# Patient Record
Sex: Male | Born: 1960 | Race: White | Hispanic: No | Marital: Married | Smoking: Never smoker
Health system: Southern US, Community
[De-identification: ages and names within clinical notes are randomized; demographics above are authoritative.]

## PROBLEM LIST (undated history)

## (undated) DIAGNOSIS — C801 Malignant (primary) neoplasm, unspecified: Secondary | ICD-10-CM

## (undated) DIAGNOSIS — R011 Cardiac murmur, unspecified: Secondary | ICD-10-CM

## (undated) DIAGNOSIS — J189 Pneumonia, unspecified organism: Secondary | ICD-10-CM

## (undated) DIAGNOSIS — R7303 Prediabetes: Secondary | ICD-10-CM

## (undated) DIAGNOSIS — I10 Essential (primary) hypertension: Secondary | ICD-10-CM

## (undated) HISTORY — PX: COLONOSCOPY: SHX174

## (undated) HISTORY — PX: APPENDECTOMY: SHX54

## (undated) HISTORY — PX: SKIN CANCER EXCISION: SHX779

## (undated) HISTORY — PX: MOHS SURGERY: SHX181

---

## 2004-08-26 ENCOUNTER — Ambulatory Visit: Payer: Self-pay

## 2005-03-17 HISTORY — PX: HERNIA REPAIR: SHX51

## 2005-08-11 ENCOUNTER — Ambulatory Visit: Payer: Self-pay | Admitting: General Surgery

## 2008-07-27 ENCOUNTER — Emergency Department: Payer: Self-pay | Admitting: Emergency Medicine

## 2008-10-12 ENCOUNTER — Ambulatory Visit: Payer: Self-pay | Admitting: Internal Medicine

## 2009-06-21 ENCOUNTER — Ambulatory Visit: Payer: Self-pay | Admitting: Sports Medicine

## 2009-06-21 DIAGNOSIS — M76829 Posterior tibial tendinitis, unspecified leg: Secondary | ICD-10-CM

## 2009-06-21 DIAGNOSIS — R269 Unspecified abnormalities of gait and mobility: Secondary | ICD-10-CM | POA: Insufficient documentation

## 2009-06-21 DIAGNOSIS — M216X9 Other acquired deformities of unspecified foot: Secondary | ICD-10-CM

## 2009-06-21 DIAGNOSIS — M25579 Pain in unspecified ankle and joints of unspecified foot: Secondary | ICD-10-CM

## 2009-07-19 ENCOUNTER — Ambulatory Visit: Payer: Self-pay | Admitting: Sports Medicine

## 2009-07-19 DIAGNOSIS — M2170 Unequal limb length (acquired), unspecified site: Secondary | ICD-10-CM | POA: Insufficient documentation

## 2010-02-18 ENCOUNTER — Ambulatory Visit: Payer: Self-pay | Admitting: Internal Medicine

## 2010-02-28 ENCOUNTER — Ambulatory Visit: Payer: Self-pay | Admitting: Sports Medicine

## 2010-02-28 DIAGNOSIS — M84369A Stress fracture, unspecified tibia and fibula, initial encounter for fracture: Secondary | ICD-10-CM | POA: Insufficient documentation

## 2010-03-04 ENCOUNTER — Ambulatory Visit: Payer: Self-pay | Admitting: Internal Medicine

## 2010-04-11 ENCOUNTER — Ambulatory Visit
Admission: RE | Admit: 2010-04-11 | Discharge: 2010-04-11 | Payer: Self-pay | Source: Home / Self Care | Attending: Sports Medicine | Admitting: Sports Medicine

## 2010-04-11 DIAGNOSIS — M25559 Pain in unspecified hip: Secondary | ICD-10-CM | POA: Insufficient documentation

## 2010-04-16 NOTE — Assessment & Plan Note (Signed)
Summary: F/U,MC   Vital Signs:  Patient profile:   50 year old male BP sitting:   155 / 89  Vitals Entered By: Lillia Pauls CMA (Jul 19, 2009 10:10 AM)  History of Present Illness: F/U post tib and flex dig tendinosis 40% better half ironman in 2 days Was able to run 10 miles in his OTC insoles with some additional arch support added by [place] sports insoles not as comfortable but also in difft shoe  doing the exercises uses voltaren gel  will be able to rest p this race  Allergies: No Known Drug Allergies  Physical Exam  General:  Well-developed,well-nourished,in no acute distress; alert,appropriate and cooperative throughout examination Msk:  less swelling at post area behind med malleolus not TTP good strength good motion  leg length shows that RT is 1.5 cms shorter excellent alignment  noter running gait shows slight shift to RT even w current heel lift he has placed a layer of foam padding to RT insole gait was improved w this   Impression & Recommendations:  Problem # 1:  ANKLE PAIN, RIGHT (ICD-719.47) improved keep up ice, exercises  Problem # 2:  TENDINITIS TIBIALIS (ICD-726.72) volt gel - cont  as long as improving will not need NTG  rescan later if sxs persiste  Problem # 3:  ABNORMALITY OF GAIT (ICD-781.2) pronation was well controlled with inserts and scaphoid pads  see below  Problem # 4:  UNEQUAL LEG LENGTH (ICD-736.81) This did seem to be affecting gait and thus with somne padding we saw improvement  if he decides to run longer distances - half or marathons will prob need custom orthotics to correct this  Complete Medication List: 1)  Voltaren 1 % Gel (Diclofenac sodium) .... Apply to affected area qid

## 2010-04-16 NOTE — Assessment & Plan Note (Signed)
Summary: NP,R MEDIAL ANKLE PAIN X MARCH 12   Vital Signs:  Patient profile:   50 year old male Height:      72 inches Weight:      190 pounds BMI:     25.86 BP sitting:   150 / 94  Vitals Entered By: Lillia Pauls CMA (June 21, 2009 10:43 AM)  History of Present Illness: 50 yo M presents with medial right ankle pain  Patient denies previous issues with right ankle/foot On March 12th went on a long run with wife and some friends Slowed down with another runner several miles into long run (about 10 min/mile pace - usually runs 8:30 pace) States while doing this on hilly course developed slowly worsening pain medial ankle and lower leg behind tibia/med malleolus. Was able to run through pain Took 5 days off and ran 4 mile run - sore first mile but picked up pace and resolved. On 3/20 had pain in first 2 miles of a half marathon when running a slow pace (large crowd) - picked up pace for remainder and pain resolved. Pain seems worse when running downhill No obvious swelling or bruising. Has not tried anything for pain.  Allergies (verified): No Known Drug Allergies  Physical Exam  General:  Well-developed,well-nourished,in no acute distress; alert,appropriate and cooperative throughout examination Msk:  R foot/ankle: No gross deformity, swelling, bruising Cavus feet Normal post tib firing when on tiptoes No focal TTP but course of post tib and some deep retrocalcaneal area is where he points as pain is here during runs. FROM toes and ankle. Neg ant drawer and talar tilt. Strength 5/5 all motions without reproduction of pain NV intact distally Leg lengths equal SI joints tight bilaterally with fabers. Gait normal with walking and jogging. Additional Exam:  MSK u/s R ankle: post tib tendon is intact.  Achilles intact without thickening or evidence of retrocalcaneal bursitis.  Target sign distally post tib tendon and some fluid on longitudinal view surrounding tendon.  No  increased neovascularity.  Target sign of flexor digitorum as well.     Impression & Recommendations:  Problem # 1:  ANKLE PAIN, RIGHT (ICD-719.47) Assessment New  Post tib and flexor digitorum tendinitis without evidence of tearing on ultrasound.  Cushion with scaphoid pads and comforthotics to neutralize gait and foot strike, take pressure off these tendons.  Voltaren gel, exercises demonstrated.  F/u in 4 weeks for recheck.  Activities as tolerated.  Orders: [place] LIMITED ([id number])  Problem # 2:  TENDINITIS TIBIALIS (ICD-726.72) Assessment: New  See #1 above.  Orders: [place] LIMITED ([id number])  Problem # 3:  ABNORMALITY OF GAIT (ICD-781.2) Assessment: New  neutralized with scaphoid pads in insoles  Orders: [place] LIMITED ([id number])  Problem # 4:  CAVUS DEFORMITY OF FOOT, ACQUIRED (ICD-736.73) Assessment: New  comforthotics with scaphoid pads to cushion and support.  Orders: [place] LIMITED ([id number])  Complete Medication List: 1)  Voltaren 1 % Gel (Diclofenac sodium) .... Apply to affected area qid  Patient Instructions: 1)  Use the insoles with the scaphoid pads as often as possible especially when running. 2)  Ice for 15 minutes at a time 3-4 times a day - put in an ice bucket at the end of the day for 10 minutes. 3)  Pigeon toed walking, theraband exercises with plantarflexion and internal rotation 3 x 10 once a day. 4)  Raise and lower on a step 3 x 15 once a day. 5)  Follow up with [place] in 4 weeks  for reevaluation. Prescriptions: VOLTAREN 1 % GEL (DICLOFENAC SODIUM) Apply to affected area qid  #3x100g x 1   Entered and Authorized by:   Norton Blizzard MD   Signed by:   Norton Blizzard MD on 06/21/2009   Method used:   Print then Give to Patient   RxID:   [id number]

## 2010-04-18 NOTE — Assessment & Plan Note (Signed)
Summary: RT ANKLE,LOWER LEFT LEG PAIN,MC   Vital Signs:  Patient profile:   50 year old male BP sitting:   146 / 96  Vitals Entered By: Lillia Pauls CMA (February 28, 2010 11:10 AM)  CC:  Left lower leg pain and f/u R ankle.  History of Present Illness: 50yo male to office for f/u of R medial ankle pain, but with new c/o of L lower leg pain. Continues to have intermittant dull pain along medial ankle, although greatly improved.  Denies swelling or instability. Continues to run - approx 18-20 miles/wk.  Has completed 1/2 Ironman in the past, plans to do another in May 2012. During a run 12/16/09 started to experience pain along lower portion of medial shin.  Denies injury or trauma.  Has noted some swelling & small nodule in this area.  Stopped running & training for about 2-weeks, but still had pain.  Pain prominent when starting to run or walk.  Normally able to run without much pain.  Pain most noticeable about 20-mins after running. Using ibuprofen without any help. No hx of stress fractures. Continues to wear insoles with scaphoid pads & small lift on right.  No custom orthotics.  Allergies: No Known Drug Allergies PMH-FH-SH reviewed for relevance  Review of Systems      See HPI  Physical Exam  General:  Well-developed,well-nourished,in no acute distress; alert,appropriate and cooperative throughout examination Msk:  LOWER LEG: - Lt lower leg with visible nodule over medial aspect of distal tiba.  Area very TTP.  No tenderness over proximal tibia.  No erythema or bruising.  Neg Tap test.  Able to perform hop test with minimal discomfort.   - Rt lower leg without visible deformity.  No tenderness to palpation.  ANKLES: FROM without pain.  No laxity.  No weakness.  FEET:  moderate arch, no transverse arch collapse.  No significant callous formation.  GAIT: Rt leg  ~1cm shorter than left.  Mild intoeing & pronation of right foot with running.  Forefoot striker with overall good  form. Pulses:  +2/4 DP & PT b/l Neurologic:  sensation intact to light touch.   Additional Exam:  MSK U/S: L tibia- distal medial tibia with periosteal thickening and small amount of periosteal fluid seen on long view.  Transverse view shows cortical irregularity with cap-sign.  No increased doppler flow.  Findings consistent with healing tibial stress fx.  R tibia with no periosteal thickening, no periosteal fluid, & no signs of cortical disruption.  Images saved.   Impression & Recommendations:  Problem # 1:  STRESS FRACTURE, TIBIA (ICD-733.93) Assessment New  - Healing stress fx of Lt distal medial tibia as seen on MSK u/s - Fitted with ankle Aircast in office today.  Should wear this with all activity. - Ok to continuing running, encouraged to run on soft surfaces.  Should decrease running if having pain. - May want to increase cross-training with more biking/swimming - Start taking Ca 2000mg  + Vit D 800 International Units daily - Ice as needed after activity - Tylenol/ibuprofen as needed. - Cont. to wear insoles with scaphoid pads, Blue cushion added to bottom of left insole & additional layer of blue foam added to Rt insole.  May be candidate for custom orthotics in the future. - f/u 6 weeks for re-evaluation & f/u MSK U/S, encouraged to call with questions/concerns.  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 Apply to affected area qid  Other Orders: Aircast Ankle Brace ([id number])  Patient Instructions: 1)  You have a healing stress fracture of your left tibia. 2)  Ok to continue running, should cut back if having pain. 3)  Wear the aircast with any running/activity. 4)  Start taking  Calcium 2000mg  + Vit D 800 International Units daily. 5)  May ice as needed for discomfort after workouts. 6)  Follow-up 6-weeks, call with any questions or concerns.   Orders Added: 1)  Aircast Ankle Brace [[id number]] 2)  Est. Patient Level IV [82956] 3)  [place] LIMITED [[id number]]

## 2010-04-18 NOTE — Assessment & Plan Note (Signed)
Summary: F/U,MC   Vital Signs:  Patient profile:   50 year old male BP sitting:   133 / 90  Vitals Entered By: Lillia Pauls CMA (April 11, 2010 9:18 AM)  CC:  f/u stress fx.  History of Present Illness: 50yo male to office for f/u of healing L tibial stress fx.  Not having any pain at this time.  Wearing compression sleeves and using aircast with activity.  Overall left leg is feeling great. Able to run 4-5 times since last visit, but unable to run for last 2-weeks due to increasing R hip/leg pain.  Able to do stationary bike & bike trainer without pain.  Pain starts in buttock, but radiating into posterior aspect of thigh.  Thinks may have started with mild hamstring pull from doing leg press before Thanksgiving.  Since that time having increased tightness in his hamstring & he thinks this has affected his running stride.  Has hx of sciatica, but this feels different.  Went to chiropractor, but no improvement with several adjustments.  Chiropractor thought may be piriformis syndrome, went to massage therapist early this week  Buttock pain improved, but still having pain in hamstring.  Having some night-time pain.  Occasional numbness/tingling stopping at the knee.  Denies change in bowel or bladder.  Denies any back pain.  Allergies: No Known Drug Allergies  Review of Systems       per HPI, otherwise 10-pt ROS negative  Physical Exam  General:  Well-developed,well-nourished,in no acute distress; alert,appropriate and cooperative throughout examination Msk:  BACK: FROM without pain.  No scoliosis.  Minimal TTP over R piriformis & sciatic notch, no significant spasm in this area.  (+)FABER on R, mild tightness on left.  Good SI joint mobility.  neg SLR b/l.  Able to toe walk & heel walk.    HIPS:  FROM without pain, neg log roll.  Mild TTP in hamstring belly near ischial tuberosity, no palpable nodules or defects.  Good hamstring strength b/l.  Normal quad strength.  Normal abduction,  adduction, flexion, extension strength at hip b/l.  Tight piriformis on right.  LOWER LEG: - Lt lower leg: no deformity, bruising, or erythema.  Mild TTP over medial aspect of distal tibia - less tender than previous evaluation.  No tenderness over proximal tibia.  neg tap test.  Able to perform hop test. - Rt lower leg without visible deformity.  No tenderness to palpation.  Able to do hop test  ANKLES: FROM without pain.  No laxity.  No weakness.  FEET:  moderate arch, no transverse arch collapse.  No significant callous formation.  GAIT: Rt leg  ~1cm shorter than left.  Forefoot striker, mild intoeing & pronation of R foot.  Significant pain in R hip/hamstring with running.  No limp with walking. Pulses:  +2/4 DP & PT Neurologic:  sensation intact to light touch.   DTR +2/4 achilles & patella b/l   Impression & Recommendations:  Problem # 1:  STRESS FRACTURE, TIBIA (ICD-733.93) Assessment Improved - Healing L tibial stress fx - no pain with daily activities, minimal pain on exam today - Cont to wear compression sleeves & aircast with activity - Increase activity as able, decrease activity if having pain - f/u for custom orthotics in 2-3 weeks - plan for repeat msk u/s on f/u visit   Problem # 2:  HIP PAIN, RIGHT (ICD-719.45) Assessment: New - R hip pain - suspect related to right piriformis and altered gait mechanics from R short  leg - Demonstrated several lower extremity & piriformis stretches, handout given - should do these stretches at least twice daily - Also demonstrated & given handout for hip exercises including hip rotations, lateral step-ups & cross-over step-ups - Cont to cross train with biking & swimming.  Ok to start running if not having pain.  Should wear compression shorts with running/activity to keep R hip warm - f/u in 2-3 weeks for custom orthotics  Problem # 3:  UNEQUAL LEG LENGTH (ICD-736.81) -  Rt leg  ~1cm shorter than left - likely contributing to R hip  pain - cont to use temporary orthotics with lift in R shoe - f/u 2-3 weeks for custom orthotics  Complete Medication List: 1)  Voltaren 1 % Gel (Diclofenac sodium) .... Apply to affected area qid  Patient Instructions: 1)  Ok to ease back into training if not having pain.  Ok to continue cross training with biking & swimming. 2)  Run with compression shorts to help keep area warmer. 3)  do stretches for piriformis at least twice daily. 4)  Do hip exercises 1-2 times daily. 5)  Follow-up for custom orthotics on a Tues afternoon with Dr. Christella Hartigan.  will plan on ultrasound of your left tibia at that time. 6)  Call with any questions or concerns. Prescriptions: VOLTAREN 1 % GEL (DICLOFENAC SODIUM) Apply to affected area qid  #3x100g x 1   Entered by:   Rochele Pages RN   Authorized by:   Enid Baas MD   Signed by:   Rochele Pages RN on 04/11/2010   Method used:   Electronically to        [facility] [phone]* (retail)       [address]       [place], [place]  [address]       Ph: [phone]       Fax:    RxID:   [id number]    Orders Added: 1)  Est. Patient Level III [62952]

## 2010-04-23 ENCOUNTER — Encounter: Payer: Self-pay | Admitting: Family Medicine

## 2010-04-23 ENCOUNTER — Encounter (INDEPENDENT_AMBULATORY_CARE_PROVIDER_SITE_OTHER): Payer: BC Managed Care – PPO | Admitting: Family Medicine

## 2010-04-23 DIAGNOSIS — M2170 Unequal limb length (acquired), unspecified site: Secondary | ICD-10-CM

## 2010-04-23 DIAGNOSIS — M84369A Stress fracture, unspecified tibia and fibula, initial encounter for fracture: Secondary | ICD-10-CM

## 2010-04-23 DIAGNOSIS — M25559 Pain in unspecified hip: Secondary | ICD-10-CM

## 2010-05-02 NOTE — Assessment & Plan Note (Signed)
Summary: orthotics per fields,mc   Vital Signs:  Patient profile:   50 year old male Height:      72 inches Weight:      195 pounds BMI:     26.54 BP sitting:   154 / 90  Vitals Entered By: Lillia Pauls CMA (April 23, 2010 2:14 PM) CC: custom orthotics   CC:  custom orthotics.  History of Present Illness: 50yo male with hx of healing L tibial stress fx to office for custom orthotics.  No longer having any tibia pain.  Has been wearing OTC orthotics with our adjustments including b/l scaphoid pads & heel lift on right to account for leg length difference.  These have been comfortable to him.  Has not started back to running due to persistant R hip/buttock pain.  Using stationary bike.  Has been doing stretches multiple times during the day & has been going to massage therapist.  Feels slightly improved, but still present.  Denies any numbness/tingling, denies lower extremity weakness.  Denies changes in bowel/bladder.  Preventive Screening-Counseling & Management  Alcohol-Tobacco     Smoking Status: never  Allergies (verified): No Known Drug Allergies  Social History: Smoking Status:  never  Review of Systems       per HPI  Physical Exam  General:  Well-developed,well-nourished,in no acute distress; alert,appropriate and cooperative throughout examination Msk:  BACK: FROM without pain.  No scoliosis.  Minimal TTP over R piriformis, no spasm in this area.  (+)FABER on R, mild tightness on left.  Good SI joint mobility.  neg SLR b/l.      HIPS:  FROM without pain, neg log roll.  Mild TTP in hamstring belly near ischial tuberosity, no palpable nodules or defects.  Good hamstring strength b/l.  Normal quad strength.  Normal hip strength.  Tight piriformis on right.  LOWER LEG: - Lt lower leg: no deformity, bruising, or erythema. No tenderness over proximal or distal tibia.   neg tap test. - Rt lower leg without visible deformity.  No tenderness to palpation.  ANKLES: FROM  without pain.  No laxity.  No weakness.  FEET:  moderate arch, no transverse arch collapse.  No significant callous formation.  GAIT: Rt leg  ~1cm shorter than left.  Forefoot striker, mild intoeing & pronation of R foot.  No limp.  Mild pain in R hip/hamstring with running. Pulses:  +2/4 DP & PT Neurologic:  sensation intact to light touch.     Impression & Recommendations:  Problem # 1:  STRESS FRACTURE, TIBIA (ICD-733.93) Assessment Improved  - No further symptoms at this time. - Fitted with custom orthotic in office today: Patient was fitted for a : standard, cushioned, semi-rigid orthotic. The orthotic was heated and afterward the patient stood on the orthotic blank positioned on the orthotic stand. The patient was positioned in subtalar neutral position and 10 degrees of ankle dorsiflexion in a weight bearing stance. After completion of molding, a stable base was applied to the orthotic blank. The blank was ground to a stable position for weight bearing. Size: 12, blue swirl Base: blue EVA Posting: none Additional orthotic padding:  Additional layer of stick-on blue EVA applied to R heel for leg length difference Orthotics comfortable in office today & gait neutral 45-mins spent with patient for orthotic prep - May advance activity -  cont to wear compression sleeves & aircast with activity.  Reviewed Run/Walk program, cont. cross-training on days when not running. - f/u 3-4 weeks for  re-evaluation  Orders: [employer], each unit [phone])  Problem # 2:  HIP PAIN, RIGHT (ICD-719.45) Assessment: Improved  - Still with tightness in his R piriformis, suspect this may be related to leg length difference & altered gait mechanics.   - Fitted with custom orthotic as stated above - Cont. lower extremity stretches at least twice daily.  Reviewed stretches today - Cont. hip exercises & step-up exercises - Heating pad as needed - f/u 3-4 weeks for  re-evaluation  Orders: [employer], each unit (L3002)  Problem # 3:  UNEQUAL LEG LENGTH (ICD-736.81)  - R leg 1cm shorter than left - Fitted with custom orthotic in office today with additional lift on R orthotic.  comfortable in office today & leg length difference well corrected.  Orders: [employer], each unit [phone])  Complete Medication List: 1)  Voltaren 1 % Gel (Diclofenac sodium) .... Apply to affected area qid   Orders Added: 1)  Est. Patient Level IV [09811] 2)  [employer], each unit [L3002]

## 2010-05-14 ENCOUNTER — Ambulatory Visit (INDEPENDENT_AMBULATORY_CARE_PROVIDER_SITE_OTHER): Payer: BC Managed Care – PPO | Admitting: Family Medicine

## 2010-05-14 ENCOUNTER — Encounter: Payer: Self-pay | Admitting: Family Medicine

## 2010-05-14 DIAGNOSIS — M84369A Stress fracture, unspecified tibia and fibula, initial encounter for fracture: Secondary | ICD-10-CM

## 2010-05-14 DIAGNOSIS — M25559 Pain in unspecified hip: Secondary | ICD-10-CM

## 2010-05-23 NOTE — Assessment & Plan Note (Signed)
Summary: F/U L tibia & R hip   Vital Signs:  Patient profile:   50 year old male Pulse rate:   89 / minute BP sitting:   167 / 100  (right arm)  Vitals Entered By: Rochele Pages RN (May 14, 2010 2:02 PM) CC: f/u rt hip and lt tibia stress fx   CC:  f/u rt hip and lt tibia stress fx.  History of Present Illness: 50yo male for f/u L tibial stress fx & Rt hip pain.  He is approx 8-9 wks s/p L tibial stress fx.  Feels 90% improved overall, occasional pain, denies any swelling.  Started walk-run program after getting custom orthotics & running 3-4 days/wk about 4-6 miles each run and doing well.  Currently running on cushioned track at [facility].  Wearing calf sleeve, but no longer using aircast.  continues to x-train with biking & swimming.  Wearing custom orthotics & they are comfortable.  Rt hip/hamstring pain is approx 70-75% improved.  Still some occasional tightness at bottom of buttock & superior aspect of hamstring.  Continues lower extremity stretches & exercises which are helpful.  Has tried using neoprene thigh sleeve, but sometimes causes more irritation.    Preventive Screening-Counseling & Management  Alcohol-Tobacco     Smoking Status: never  Allergies (verified): No Known Drug Allergies  Review of Systems       per HPI  Physical Exam  General:  Well-developed,well-nourished,in no acute distress; alert,appropriate and cooperative throughout examination Msk:  BACK: FROM without pain.  No scoliosis.  No tenderness over piriformis today, no spasm.  Mildly (+)FABER on R, but improved compared to previous evaluation.  Good SI joint mobility.  neg SLR b/l.      HIPS:  FROM without pain, neg log roll.  Minimal TTP at ischial tuberosity & over proximal hamstrings, no palpable nodules or defects.  Good hamstring strength b/l.  Normal quad strength.  Normal hip strength.  Minimal piriformis tightness today.    LOWER LEG: - Lt lower leg: no deformity, bruising, or erythema. No  tenderness over proximal or distal tibia.   neg tap test.  Able to perform hop test. - Rt lower leg without visible deformity.  No tenderness to palpation.  Neurovascularly intact distally   Impression & Recommendations:  Problem # 1:  STRESS FRACTURE, TIBIA (ICD-733.93) Assessment Improved - >8-weeks out from diagnosis, progressing well - Cont. walk-run program every other day with cross training on off days - May start trying to run continuously up to 20-mins next week - should try to stay on soft/level surfaces; may start to increase duration & frequency of runs if tolerating well.  Reviewed Tibial Stress Fx Tx Protocol - Cont. running with calf sleeve, do not need brace at this time - Cont. to wear custom orthotics - f/u 4 weeks  Problem # 2:  HIP PAIN, RIGHT (ICD-719.45) Assessment: Improved  - Continuing to improve, noted to have improved piriformis flexibility.  Mild amount of pain near origin of hamstrings. - fitted with body helix thigh sleeve today which was comfortable - wear this with activity - cont. lower extremity stretching exercises - Reviewed hamstring exercises - cont. cross training - f/u 4 weeks  Orders: Garment,belt,sleeve or other covering ,elastic or similar stretch (B1478)  Complete Medication List: 1)  Voltaren 1 % Gel (Diclofenac sodium) .... Apply to affected area qid   Orders Added: 1)  Est. Patient Level III [29562] 2)  Garment,belt,sleeve or other covering ,elastic or similar stretch [  A4466]

## 2010-07-02 ENCOUNTER — Ambulatory Visit: Payer: BC Managed Care – PPO | Admitting: Family Medicine

## 2010-07-18 ENCOUNTER — Encounter: Payer: Self-pay | Admitting: Family Medicine

## 2010-07-18 ENCOUNTER — Ambulatory Visit (INDEPENDENT_AMBULATORY_CARE_PROVIDER_SITE_OTHER): Payer: BC Managed Care – PPO | Admitting: Family Medicine

## 2010-07-18 DIAGNOSIS — M84369A Stress fracture, unspecified tibia and fibula, initial encounter for fracture: Secondary | ICD-10-CM

## 2010-07-18 DIAGNOSIS — M2170 Unequal limb length (acquired), unspecified site: Secondary | ICD-10-CM

## 2010-07-18 DIAGNOSIS — M25559 Pain in unspecified hip: Secondary | ICD-10-CM

## 2010-07-18 MED ORDER — KETOPROFEN POWD: Status: DC

## 2010-07-18 NOTE — Assessment & Plan Note (Signed)
-   No pain at this time, completely resolved.

## 2010-07-18 NOTE — Progress Notes (Signed)
  Subjective:    Patient ID: Bobby Pace, male    DOB: 1960-11-08, 50 y.o.   MRN: [id number]  HPI 50yo male for f/u L tibial stress fx & Rt hip pain.   Patient is approximately 5 months status post left tibial stress fracture. He has been back to running approximately 20-25 miles a week without any pain. He has had no symptoms for quite some time.  Patient continues to have some persistent right hip/hamstring pain, but this is also greatly improved. Pain currently in the high hamstring and gluteal area it is intermittent in nature. Will occasionally feel tight prior to activity, but will loosen up about a half mile into runs. Has occasional fullness in his right gluteal area as if he is sitting on a deck of cards. He continues to do his lower chin he stretches and notes greatly improved flexibility. Has been using voltaren gel on this area intermittently which has been helpful, but is now out.  He took a felt heel lift out of his shoes recently which were placed to help with conditioning following a stress fracture and he felt significant improvement in his right hip pain. Has not been wearing his custom orthotics much because she feels lift in the right shoe has been too much. He continues to run, bike and swim.  Has been seeing a chiropractor intermittently since mid February and this has been helpful.  Review of Systems Per HPI, otherwise negative    Objective:   Physical Exam General: Well-developed,well-nourished,in no acute distress; alert,appropriate and cooperative throughout examination  Msk: BACK: FROM without pain. No scoliosis.  Mildly tender to palpation over the right piriformis and right ischial tuberosity, no palpable spasm. Negative FABER. Good SI joint mobility. neg SLR b/l.  HIPS: FROM without pain, neg log roll. Mild TTP at ischial tuberosity & over proximal hamstrings, no palpable nodules or defects. Good hamstring strength b/l. Normal quad strength. Normal hip strength. No  piriformis tightness today. LOWER LEG:  - Lt lower leg: no deformity, bruising, or erythema. No tenderness over proximal or distal tibia. neg tap test.  - Rt lower leg without visible deformity. No tenderness to palpation.  GAIT: Right leg approximately 5 mm shorter than the left. Good running form noted today, forefoot strikeer, pelvis level and no limping. Neurovascularly intact distally  MSK ultrasound:  Ultrasound of the right posterior hip at the ischial tuberosity revealed hypoechoic area near insertion of hamstring musculature consistent with ischial bursa. Normal-appearing hamstring tendons and hamstring muscles. No increased Doppler flow or neo-vessels appreciated. Images saved.        Assessment & Plan:

## 2010-07-18 NOTE — Progress Notes (Signed)
  Subjective:    Patient ID: Bobby Pace, male    DOB: [birth year], 50 y.o.   MRN: [id number]  HPI Patient is here today to followup on his left tibial stress fracture and his right heel/hamstring pain. Patient states that his tibial stress fracture on the left is completely healed and has no pain at all associated with the original injury. As far as his right hip/hamstring pain he still has some high hamstring/low gluteal pain from time to time as he runs. Typically a half a mile into a long run he loosens up and doesn't feel as bad during runs and after runs.    Review of Systems     Objective:   Physical Exam        Assessment & Plan:

## 2010-07-18 NOTE — Assessment & Plan Note (Addendum)
-   Right hip pain continues to improve. MSK ultrasound showed small fluid collection at the ischial tuberosity consistent with ischial bursitis.  Normal appearance of tendons at insertion on ischial tuberosity and normal appearance of muscle fibers. - Patient noted improvement in hip pain after removing heel lifts from his shoes that were placed to allow more cushion after his tibial stress fracture. Okay for him to continue running without these in his shoes. Custom orthotics have also been uncomfortable due to lift added to the right orthotic for leg length difference. This was ground down in the office today to make more comfortable. - Cont lower extremity stretching program & hip exercises - Encourage use of tennis ball under the area as a massage. - Given Rx for ketoprofen gel as he did have some improvement with voltaren gel which is currently discontinued - Should symptoms persist could consider merits of ultrasound guided corticosteroid injection - May continue to advance activity as tolerated - Followup as needed

## 2010-07-18 NOTE — Assessment & Plan Note (Signed)
-   Rt leg approx 5mm shorter than the right today.  Additional heel lift on right custom orthotic was ground down, but still leaving some additional lift.  Hope this will be more comfortable while he is running.

## 2011-01-22 ENCOUNTER — Ambulatory Visit (INDEPENDENT_AMBULATORY_CARE_PROVIDER_SITE_OTHER): Payer: BC Managed Care – PPO | Admitting: Sports Medicine

## 2011-01-22 VITALS — BP 138/80

## 2011-01-22 DIAGNOSIS — M25559 Pain in unspecified hip: Secondary | ICD-10-CM

## 2011-01-22 DIAGNOSIS — R269 Unspecified abnormalities of gait and mobility: Secondary | ICD-10-CM

## 2011-01-22 DIAGNOSIS — M2170 Unequal limb length (acquired), unspecified site: Secondary | ICD-10-CM

## 2011-01-22 MED ORDER — GABAPENTIN 300 MG PO CAPS
300.0000 mg | ORAL_CAPSULE | Freq: Every day | ORAL | Status: DC
Start: 2011-01-22 — End: 2011-05-05

## 2011-01-23 NOTE — Progress Notes (Signed)
  Subjective:    Patient ID: Bobby Pace, male    DOB: [birth year], 50 y.o.   MRN: [id number]  HPI Patient here for f/u of R gluteal pain and low back pain. 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 He denies pain with running. He  reports pain immediately following open swims. He describes severe sharp pain with low back flexion. The pain is usually isolated to the  right gluteal area, but  is occasionally right lumbar that radiates towards the right gluteal. He is not taking medication for the pain. He discontinue ketoprofen gel because he did not feel an improvement. He continues to massage with the tennis ball.   He currently participates in active release therapy for R hamstring pain.   He also has known leg length discrepancy. He is not wearing his orthotic due to discomfort.   Review of Systems Negative for isolated injury. Negative for fever, chills, weight loss, fatigue.     Objective:   Physical Exam GEN: well built/muscular male. NAD.  Back Exam:  Inspection:muscular, no scoliosis  Motion: Full ROM with no pain on flexion or extension  SLR seated:  Negative                     SLR lying: Negative  XSLR seated: Negative                    XSLR lying: Negative  Seated HS Flexibility: to mid shin.  Palpable tenderness: pain at R piriformis illicited with deep palpation and hip extension.  FABER: Positive for pain with R hip extension.  Sensory change: none  Reflex change: none   Strength at foot Plantar-flexion: 5 / 5    Dorsi-flexion: 5 / 5    Eversion: 5 / 5   Inversion:  5/ 5 Leg strength Quad:  5/ 5   Hamstring: 5 / 5   Hip flexor:  5/ 5   Hip abductors: 5 / 5 Gait Jogging: R leg approximately 1.5cm shorter than left,  in swinging gait to compensate for leg length inequality.       Assessment & Plan:

## 2011-01-23 NOTE — Patient Instructions (Signed)
Bobby Pace thank you for coming in today.  Your  continued right  Gluteal pain is  Cause by a from piriformis irritation with no with an element of sciatic nerve irritation and. Please start gabapentin nightly. If you work on your flexibility,  and   should be able to begin training for your triathlon in June.   You may continue to do the monthly active release therapy  for tight hamstrings.

## 2011-01-23 NOTE — Assessment & Plan Note (Addendum)
A: Patient now has pain following swimming. He has symptoms of sciatic nerve irritation.  P:  -Start Gabapentin q HS.  - Continue to advance activity as tolerated.  - Gave patient exercises to work on flexibility.

## 2011-01-23 NOTE — Assessment & Plan Note (Addendum)
A: untreated. Previous orthotic was uncomfortable, so he is not using it.  P: Soft heel orthotic to compensate for discrepancy.

## 2011-01-24 NOTE — Assessment & Plan Note (Signed)
Cont correction w orthotics Add heel lift  To regular shoes

## 2011-03-05 ENCOUNTER — Ambulatory Visit (INDEPENDENT_AMBULATORY_CARE_PROVIDER_SITE_OTHER): Payer: BC Managed Care – PPO | Admitting: Sports Medicine

## 2011-03-05 VITALS — BP 138/78

## 2011-03-05 DIAGNOSIS — M25559 Pain in unspecified hip: Secondary | ICD-10-CM

## 2011-03-05 MED ORDER — MELOXICAM 15 MG PO TABS: ORAL_TABLET | ORAL | Status: DC

## 2011-03-05 NOTE — Progress Notes (Signed)
  Subjective:    Patient ID: Bobby Pace, male    DOB: 12/06/1960, 50 y.o.   MRN: [id number]  HPI Brentley comes in for followup of his right buttock pain. He has a diagnosis of performed syndrome. He was seen prior, and started on gabapentin as well as some stretching. He notes that he is significantly better, however he still has some high hamstring pain. He notes this is worse after standing for long periods of time. Otherwise his range of motion is better, he doesn't really have any pain, strength is good. He is now able to run and bike with minimal sxs and actually standing or sitting too long are worse  Review of Systems    no fevers, chills, night sweats, weight loss. Objective:   Physical Exam General:  Well developed, well nourished, and in no acute distress. Neuro:  Alert and oriented x3, extra-ocular muscles intact. Skin: Warm and dry. Respiratory:  Not using accessory muscles, speaking in full sentences. MSK: Tender to palpation at left ischial tuberosity.  Hamstring strength is good.  Hip internal and external rotation is 60 bilaterally. Popliteal angle is 70 bilaterally indicating tight hamstrings. Negative straight leg raise. Strength, sensation, and deep tendon reflexes are symmetric throughout.      Assessment & Plan:

## 2011-03-05 NOTE — Assessment & Plan Note (Signed)
I think he is doing well with the above treatment. I would like him to work more on his excessively tight hamstrings. I would also like him to go ahead and start an anti-inflammatory on a daily basis for 2 weeks for his ischial bursitis. We can see him back in 4-6 weeks, if no better we can consider an injection into his ischial bursa.

## 2011-03-05 NOTE — Patient Instructions (Signed)
Pelvic tilts. Back bridges. WORK ON THE HAMSTRING TIGHTNESS! Meloxicam daily for 2 weeks. Come back to see [place] in 4-6 weeks.

## 2011-04-03 ENCOUNTER — Ambulatory Visit (INDEPENDENT_AMBULATORY_CARE_PROVIDER_SITE_OTHER): Payer: BC Managed Care – PPO | Admitting: Sports Medicine

## 2011-04-03 VITALS — BP 128/72

## 2011-04-03 DIAGNOSIS — M25559 Pain in unspecified hip: Secondary | ICD-10-CM

## 2011-04-03 NOTE — Patient Instructions (Addendum)
Continue gabapentin 300 mg daily  Continue meloxicam as needed  Please follow up in 4-6 weeks   Thank you for seeing [place] today!

## 2011-04-03 NOTE — Assessment & Plan Note (Signed)
His piriformis syndrome seems to be improving  We will keep up the exercises and stretches  Use Mobic when necessary  Keep up the gabapentin 300 at night  Recheck 6 weeks and at that time we might consider weaning the gabapentin if he continues to do well

## 2011-04-03 NOTE — Progress Notes (Signed)
  Subjective:    Patient ID: Fares Ramthun, male    DOB: 02-03-1961, 51 y.o.   MRN: [id number]  HPI  Pt presents to clinic for f/u of rt buttock pain which he states is significantly improved. Taking gabapentin 300 mg qhs. Took meloxicam daily for 2 weeks- this was helpful.  Now takes every 2 -3 days.  Did squats 2 days ago which aggravated rt buttock pain. Doing a lot of hamstring stretching bilaterally.  Was able to ride 80 miles total this past weekend, without sciatic pain.  Did not have to adjust saddle.  No longer having pain with swimming, stretching in between laps helps.   Review of Systems     Objective:   Physical Exam  NAD 70 deg straight leg raise on rt without pain 80 deg straight leg raise on lt without pain Direct pressure over rt piriformis still TTP Abduction and rotation strength excellent on rt Excellent hand strength strength bilaterally in testing this does not reproduce any pain       Assessment & Plan:

## 2011-05-01 ENCOUNTER — Ambulatory Visit (INDEPENDENT_AMBULATORY_CARE_PROVIDER_SITE_OTHER): Payer: BC Managed Care – PPO | Admitting: Sports Medicine

## 2011-05-01 VITALS — BP 138/80

## 2011-05-01 DIAGNOSIS — M25559 Pain in unspecified hip: Secondary | ICD-10-CM

## 2011-05-01 NOTE — Assessment & Plan Note (Signed)
He is currently pain free on almost all of his workouts The gabapentin seems to have really helped He uses only a small amount of Mobic  He will continue exercises, stretches and gabapentin. We will try to start a weaning process of the gabapentin if he is pain free after another month. He will see me in approximately 3 months

## 2011-05-01 NOTE — Progress Notes (Signed)
Patient ID: Bobby Pace, male   DOB: 21-Sep-1960, 51 y.o.   MRN: [id number]  Subjective:    Patient ID: Bobby Pace, male    DOB: [birth year], 51 y.o.   MRN: [id number]  HPI  Pt presents to clinic for f/u of rt buttock pain which he states is significantly improved even from last visit. Taking gabapentin 300 mg qhs.  Took meloxicam daily for 2 weeks- this was helpful.  Now takes every 3 days.   No lifting or squats  Doing a lot of hamstring stretching bilaterally.  Was able to ride 40 mile ride. Long run of 8.5 miles total this past weekend, without sciatic pain.   Did not have to adjust saddle.  No longer having pain with swimming, but does if he does drills in water    Review of Systems     Objective:   Physical Exam  NAD  85 deg straight leg raise on rt without pain 90 deg straight leg raise on lt without pain Direct pressure over rt piriformis no TTP and no TTP over hamstring Good hamstring strength bilaterally Running form neutral, no foot turnout on Rt Abduction and rotation strength excellent on rt Excellent hamstring strength strength bilaterally in testing this does not reproduce any pain Good motion of both SI joints     Assessment & Plan:

## 2011-05-01 NOTE — Patient Instructions (Signed)
One set of hip abduction exercises daily Piriformis stretch daily Be sure Rt foot doesn't drift out when running  Stay on GABA pinton for 4 more weeks Every other day for 2 wks, every 3rd day for next 2 wks Follow up in 3 months

## 2011-05-05 ENCOUNTER — Other Ambulatory Visit: Payer: Self-pay | Admitting: *Deleted

## 2011-05-05 MED ORDER — GABAPENTIN 300 MG PO CAPS: ORAL_CAPSULE | ORAL | Status: DC

## 2011-05-17 ENCOUNTER — Other Ambulatory Visit: Payer: Self-pay | Admitting: Family Medicine

## 2011-05-18 NOTE — Telephone Encounter (Signed)
Refill request

## 2011-05-20 ENCOUNTER — Other Ambulatory Visit: Payer: Self-pay | Admitting: Sports Medicine

## 2011-07-10 ENCOUNTER — Ambulatory Visit (INDEPENDENT_AMBULATORY_CARE_PROVIDER_SITE_OTHER): Payer: BC Managed Care – PPO | Admitting: Sports Medicine

## 2011-07-10 VITALS — BP 143/78

## 2011-07-10 DIAGNOSIS — M25559 Pain in unspecified hip: Secondary | ICD-10-CM

## 2011-07-10 DIAGNOSIS — S76319A Strain of muscle, fascia and tendon of the posterior muscle group at thigh level, unspecified thigh, initial encounter: Secondary | ICD-10-CM

## 2011-07-10 DIAGNOSIS — IMO0002 Reserved for concepts with insufficient information to code with codable children: Secondary | ICD-10-CM

## 2011-07-10 NOTE — Progress Notes (Signed)
  Subjective:    Patient ID: Bobby Pace, male    DOB: Oct 10, 1960, 51 y.o.   MRN: [id number]  HPI  Mr. Beigel is 85% better regarding his right hamstring pain, he has wean off from neurontin without any numbness or tingling. He has been training regularly as he is getting ready for a Half iron distance in 5 1/2 weeks. He is not using the guide sleeve as he believe he changes his gait. He states that after he works out he is hamstring pain gets better. He has not been doing any strengthening hamstring exercise in the last 12 weeks. He denies any proximal hamstring pain, he localized the pain at the present time more distal on the hamstring muscle. Patient Active Problem List  Diagnoses  . ANKLE PAIN, RIGHT  . TENDINITIS TIBIALIS  . CAVUS DEFORMITY OF FOOT, ACQUIRED  . UNEQUAL LEG LENGTH  . ABNORMALITY OF GAIT  . HIP PAIN, RIGHT    Current Outpatient Prescriptions on File Prior to Visit  Medication Sig Dispense Refill  . gabapentin (NEURONTIN) 300 MG capsule Take one tab qod for 2 weeks, then take one tab every third day for 2 weeks  15 capsule  0  . gabapentin (NEURONTIN) 300 MG capsule TAKE ONE CAPSULE BY MOUTH EVERY NIGHT AT BEDTIME  30 capsule  2  . meloxicam (MOBIC) 15 MG tablet One tab PO qAM for 2 weeks then PRN.  30 tablet  3   No Known Allergies   Review of Systems  Constitutional: Negative for fever, chills, diaphoresis and fatigue.  Musculoskeletal: Negative for back pain, joint swelling, arthralgias and gait problem.  Neurological: Negative for seizures, weakness and numbness.       Objective:   Physical Exam  Constitutional: He is oriented to person, place, and time. He appears well-developed and well-nourished.       BP 143/78   Pulmonary/Chest: Effort normal.  Musculoskeletal:       R posterior thigh  with intact skin. No tenderness to palpation on the right ischium. There is mild tenderness to the patient in the hamstring muscle. Strain of the right  hamstring beyond  100 of flexion 4/5 compared with the left side. Neurovascularly intact. Gait independent without a limp  Neurological: He is alert and oriented to person, place, and time. He has normal reflexes.  Skin: Skin is warm. No rash noted. No erythema. No pallor.  Psychiatric: He has a normal mood and affect. His behavior is normal. Thought content normal.    MSK U/S Right hamstring: We are chronic changes in the hamstring attachment in the skilled tuberosity. No increased Doppler flow activity. Hamstring muscle looks intact.      Assessment & Plan:   1. HIP PAIN, RIGHT   2. Hamstring muscle strain

## 2011-07-10 NOTE — Patient Instructions (Signed)
Start hamstring strengthening exercises, beginning with leg curls and posterior leg extensions with the knee straight Progress to Nordic exercises after 2-3 weeks ice in the hamstring area after exercises Follow up PRN

## 2011-10-15 ENCOUNTER — Ambulatory Visit (INDEPENDENT_AMBULATORY_CARE_PROVIDER_SITE_OTHER): Payer: BC Managed Care – PPO | Admitting: Sports Medicine

## 2011-10-15 VITALS — BP 144/94

## 2011-10-15 DIAGNOSIS — M999 Biomechanical lesion, unspecified: Secondary | ICD-10-CM

## 2011-10-15 DIAGNOSIS — M25559 Pain in unspecified hip: Secondary | ICD-10-CM

## 2011-10-15 DIAGNOSIS — M25551 Pain in right hip: Secondary | ICD-10-CM | POA: Insufficient documentation

## 2011-10-15 NOTE — Assessment & Plan Note (Signed)
Decision today to treat with OMT was based on Physical Exam  After verbal consent patient was treated with HVLA techniques in sacrum areas  Patient tolerated the procedure well with improvement in symptoms  Patient given exercises, stretches and lifestyle modifications  See medications in patient instructions if given  Patient will follow up in 6 weeks

## 2011-10-15 NOTE — Patient Instructions (Addendum)
Very nice to meet you. Do the stretches and exercises that are giving you to try to help your hip. Your very locked up today. This because your hip flexors are too tight and your hip abductor's 2 weeks. Take some ibuprofen 600 mg 3 times a day for 3 days to decrease inflammation. If [place] Distance helps you can continue seeing him as well you can tell him that your sacrum was rotated left on left today. Try these exercises for the next 3-4 weeks if you're still having significant pain or problems please come back in for reevaluation.

## 2011-10-15 NOTE — Assessment & Plan Note (Signed)
Patient has had this pain previously. Patient did improve and is doing exercises but has discontinued them. Encourage patient to continue to do exercises and given him more encouragement to strengthen hip abductors as well as stretches hip flexors. I think this exacerbation caused more from overuse and increasing his training regimen too quickly more than not wearing his orthotics due to his leg length discrepancy. Encourage him to do 3 days of anti-inflammatories scheduled, given him handouts for exercises, and then will return in 6 weeks if still having any discomfort.

## 2011-10-15 NOTE — Progress Notes (Signed)
  Subjective:    Patient ID: Bobby Pace, male    DOB: 01/02/1961, 51 y.o.   MRN: [id number]  HPI Patient here for f/u of R hip pain. 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 He denies pain with running. He  reports pain immediately following swims. Patient also states that he has increased his taking exercises in the pool significantly in the course of the last 2 weeks. Patient states that this hip pain this time, which she has been seen for previously, started approximately 2 weeks ago. The pain is usually isolated to the  right gluteal area, but  is occasionally to groin as well. He is not taking medication for the pain.  He continues to massage with the tennis ball. Not doing the stretches that he was doing previously which did help. In addition to this patient has seen Dr Vear Clock, a chiropractor who told him that he is very stiff in the area but he does not know if the adjustment has helped significantly.    He also has known leg length discrepancy. He is not wearing his orthotic due to discomfort.   Review of Systems Negative for isolated injury. Negative for fever, chills, weight loss, fatigue. No dysuria or hematuria the    Objective:   Physical Exam GEN: well built/muscular male. NAD.  Back Exam:  Inspection:muscular, no scoliosis  Motion: Full ROM with no pain on flexion or extension  SLR seated:  Negative                     SLR lying: Negative  XSLR seated: Negative                    XSLR lying: Negative  Seated HS Flexibility: to mid shin.  Palpable tenderness: pain at R piriformis illicited with deep palpation and hip extension.  FABER: Mildly positive for pain with R hip extension.  Sensory change: none  Reflex change: none   Strength at foot Plantar-flexion: 5 / 5    Dorsi-flexion: 5 / 5    Eversion: 5 / 5   Inversion:  5/ 5 Leg strength Quad:  5/ 5   Hamstring: 5 / 5   Hip flexor:  5/ 5   Hip abductors: 2 / 5.   Hip abductor strength  significantly weak 2/5. Patient psoas muscles are significantly tight bilaterally as well.  Osteopathic findings Sacrum and rotated left on left which is causing strain on his right piriformis muscle.    Assessment & Plan:

## 2011-10-17 ENCOUNTER — Ambulatory Visit: Payer: BC Managed Care – PPO | Admitting: Sports Medicine

## 2011-11-04 ENCOUNTER — Ambulatory Visit (INDEPENDENT_AMBULATORY_CARE_PROVIDER_SITE_OTHER): Payer: BC Managed Care – PPO | Admitting: Family Medicine

## 2011-11-04 VITALS — BP 140/70 | Ht 72.0 in | Wt 195.0 lb

## 2011-11-04 DIAGNOSIS — M999 Biomechanical lesion, unspecified: Secondary | ICD-10-CM

## 2011-11-04 DIAGNOSIS — M79609 Pain in unspecified limb: Secondary | ICD-10-CM

## 2011-11-04 DIAGNOSIS — M25559 Pain in unspecified hip: Secondary | ICD-10-CM

## 2011-11-04 DIAGNOSIS — M25551 Pain in right hip: Secondary | ICD-10-CM

## 2011-11-04 NOTE — Assessment & Plan Note (Signed)
Decision today to treat with OMT was based on Physical Exam  After verbal consent patient was treated with HVLA techniques in sacral areas  Patient tolerated the procedure well with improvement in symptoms  Patient given exercises, stretches and lifestyle modifications  See medications in patient instructions if given  Patient will follow up in PRN

## 2011-11-04 NOTE — Patient Instructions (Signed)
Popliteus Tendinitis with Rehab Tendonitis is a condition that is characterized by inflammation of a tendon. A tendon is the soft tissue that connects muscles to the skeletal system allowing for body movements. Popliteus tendonitis affects the popliteus tendon, which connects the popliteus muscle to the thigh bone (femur) near the knee. The popliteus muscle helps bend and rotate the knee. Popliteus tendonitis is often caused by a tendon tear (strain). Strains are classified into three categories. Grade 1 strains cause pain, but the tendon is not lengthened. Grade 2 strains include a lengthened ligament due to the ligament being stretched or partially ruptured. With grade 2 strains there is still function, although the function may be diminished. Grade 3 strains are characterized by a complete tear of the tendon or muscle, and function is usually impaired. SYMPTOMS   Pain in the knee, specifically the outer (lateral) and back (posterior) portions.   Pain that worsens with use of the popliteus muscle (standing on a slightly bent knee or rotating the knee).   A crackling sound (crepitation) when the tendon is moved or touched (uncommon, except when tested just after exercising).  CAUSES Popliteus tendonitis occurs when damage to the popliteus tendon elicits an inflammatory (healing) response. Popliteus tendonitis is often an overuse injury. RISK INCREASES WITH:  Activities that require extensive running or walking downhill.   Poor strength and flexibility.   Failure to warm-up properly before activity.   Flat feet.  PREVENTION  Warm up and stretch properly before activity.   Allow for adequate recovery between workouts.   Maintain physical fitness:   Strength, flexibility, and endurance.   Cardiovascular fitness.   Learn and implement proper training regimens and sports technique.   Arch supports (orthotics) for individuals with flat feet.  PROGNOSIS  If treated properly, then the  symptoms of popliteus tendonitis usually resolve within 6 weeks.  RELATED COMPLICATIONS   Prolonged healing time, if improperly treated or re-injured.   Recurrent symptoms that result in a chronic problem.  TREATMENT  Treatment initially involves the use of ice and medication to help reduce pain and inflammation. The use of strengthening and stretching exercises may help reduce pain with activity. These exercises may be performed at home or with referral to a therapist. Many individuals find that the use of a compression bandage or a knee sleeve helps reduce symptoms. If you have flat feet, then your caregiver may recommend arch supports. It is important to learn/modify techniques for running uphill/downhill that do not aggravate your symptoms. If symptoms persist for greater than 6 months despite conservative (non-surgical) treatment, then surgery may be recommended to remove the tendon sheath (lining).  MEDICATION   If pain medication is necessary, then nonsteroidal anti-inflammatory medications, such as aspirin and ibuprofen, or other minor pain relievers, such as acetaminophen, are often recommended.   Do not take pain medication within 7 days before surgery.   Prescription pain relievers may be given if deemed necessary by your caregiver. Use only as directed and only as much as you need.  HEAT AND COLD  Cold treatment (icing) relieves pain and reduces inflammation. Cold treatment should be applied for 10 to 15 minutes every 2 to 3 hours for inflammation and pain and immediately after any activity that aggravates your symptoms. Use ice packs or massage the area with a piece of ice (ice massage).   Heat treatment may be used prior to performing the stretching and strengthening activities prescribed by your caregiver, physical therapist, or athletic trainer. Use a heat  pack or soak the injury in warm water.  SEEK MEDICAL CARE IF:  Treatment seems to offer no benefit, or the condition  worsens.   Any medications produce adverse side effects.  EXERCISES RANGE OF MOTION (ROM) AND STRETCHING EXERCISES - Popliteus Tendinitis These exercises may help you when beginning to rehabilitate your injury. Your symptoms may resolve with or without further involvement from your physician, physical therapist or athletic trainer. While completing these exercises, remember:   Restoring tissue flexibility helps normal motion to return to the joints. This allows healthier, less painful movement and activity.   An effective stretch should be held for at least 30 seconds.   A stretch should never be painful. You should only feel a gentle lengthening or release in the stretched tissue.  STRETCH - Gastroc, Standing   Place hands on wall.   Extend right / left leg, keeping the front knee somewhat bent.   Slightly point your toes inward on your back foot.   Keeping your right / left heel on the floor and your knee straight, shift your weight toward the wall, not allowing your back to arch.   You should feel a gentle stretch in the right / left calf. Hold this position for __________ seconds.  Repeat __________ times. Complete this stretch __________ times per day. STRETCH - Soleus, Standing   Place hands on wall.   Extend right / left leg, keeping the other knee somewhat bent.   Slightly point your toes inward on your back foot.   Keep your right / left heel on the floor, bend your back knee, and slightly shift your weight over the back leg so that you feel a gentle stretch deep in your back calf.   Hold this position for __________ seconds.  Repeat __________ times. Complete this stretch __________ times per day. STRETCH - Gastrocsoleus, Standing  Note: This exercise can place a lot of stress on your foot and ankle. Please complete this exercise only if specifically instructed by your caregiver.   Place the ball of your right / left foot on a step, keeping your other foot firmly on  the same step.   Hold on to the wall or a rail for balance.   Slowly lift your other foot, allowing your body weight to press your heel down over the edge of the step.   You should feel a stretch in your right / left calf.   Hold this position for __________ seconds.   Repeat this exercise with a slight bend in your right / left knee.  Repeat __________ times. Complete this stretch __________ times per day.  STRETCH - Hamstrings, Standing  Stand or sit and extend your right / left leg, placing your foot on a chair or foot stool   Keeping a slight arch in your low back and your hips straight forward.   Lead with your chest and lean forward at the waist until you feel a gentle stretch in the back of your right / left knee or thigh. (When done correctly, this exercise requires leaning only a small distance.)   Hold this position for __________ seconds.  Repeat __________ times. Complete this stretch __________ times per day. STRETCH - Hamstrings, Supine   Lie on your back. Loop a belt or towel over the ball of your right / left foot.   Straighten your right / left knee and slowly pull on the belt to raise your leg. Do not allow the right / left knee to  bend. Keep your opposite leg flat on the floor.   Raise the leg until you feel a gentle stretch behind your right / left knee or thigh. Hold this position for __________ seconds.  Repeat __________ times. Complete this stretch __________ times per day.  STRETCH - Hamstrings, Doorway  Lie on your back with your right / left leg extended and resting on the wall and the opposite leg flat on the ground through the door. Initially, position your bottom farther away from the wall than the illustration shows.   Keep your right / left knee straight. If you feel a stretch behind your knee or thigh, hold this position for __________ seconds.   If you do not feel a stretch, scoot your bottom closer to the door, and hold __________ seconds.    Repeat __________ times. Complete this stretch __________ times per day.  STRETCH - Quadriceps, Prone   Lie on your stomach on a firm surface, such as a bed or padded floor.   Bend your right / left knee and grasp your ankle. If you are unable to reach, your ankle or pant leg, use a belt around your foot to lengthen your reach.   Gently pull your heel toward your buttocks. Your knee should not slide out to the side. You should feel a stretch in the front of your thigh and/or knee.   Hold this position for __________ seconds.  Repeat __________ times. Complete this stretch __________ times per day.  STRENGTHENING EXERCISES - Popliteus Tendinitis These exercises may help you when beginning to rehabilitate your injury. They may resolve your symptoms with or without further involvement from your physician, physical therapist or athletic trainer. While completing these exercises, remember:   Muscles can gain both the endurance and the strength needed for everyday activities through controlled exercises.   Complete these exercises as instructed by your physician, physical therapist or athletic trainer. Progress the resistance and repetitions only as guided.  STRENGTH - Hamstring, Isometrics   Lie on your back on a firm surface.   Bend your right / left knee approximately __________ degrees.   Dig your heel into the surface as if you are trying to pull it toward your buttocks. Tighten the muscles in the back of your thighs to "dig" as hard as you can without increasing any pain.   Hold this position for __________ seconds.   Release the tension gradually and allow your muscle to completely relax for __________ seconds in between each exercise.  Repeat __________ times. Complete this exercise __________ times per day.  STRENGTH - Hamstring, Curls   Lay on your stomach with your legs extended. (If you lay on a bed, your feet may hang over the edge.)   Tighten the muscles in the back of your  thigh to bend your right / left knee up to 90 degrees. Keep your hips flat on the bed/floor.   Hold this position for __________ seconds.   Slowly lower your leg back to the starting position.  Repeat __________ times. Complete this exercise __________ times per day.  OPTIONAL ANKLE WEIGHTS: Begin with ____________________, but DO NOT exceed ____________________. Increase in1 lb/0.5 kg increments. Document Released: 03/03/2005 Document Revised: 02/20/2011 Document Reviewed: 06/15/2008 [facility] Patient Information 2012 [place], [place].

## 2011-11-04 NOTE — Assessment & Plan Note (Signed)
Patient does have what appears to be a popliteal muscle spasm on the right calf on the lateral aspect. Patient given rehabilitation stretches to do. Patient is going continue doing ice as well as heat modalities to see if this helps. Patient can also wear a catheter sleeve with exercises as well as within the first 2 hours after exercise. Patient will then come back for followup in 3-4 weeks if not significantly improved. At that time if her still considering having trouble will consider doing imaging to make sure there is no significant muscle tear.

## 2011-11-04 NOTE — Assessment & Plan Note (Signed)
Continue to be addressed do to muscle imbalances and poor flexibility. Encourage patient to continue the exercising and stretching at this time. Patient declined doing formal physical therapy again to try to help with range of motion of the hip joint. Patient will followup with this as needed but it is encouraging that he continues to improve.

## 2011-11-04 NOTE — Progress Notes (Signed)
  Subjective:    Patient ID: Bobby Pace, male    DOB: 18-Nov-1960, 51 y.o.   MRN: [id number]  HPI Patient here for f/u of R hip pain. 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 He denies pain with running. Patient has been improving since last visit. Patient has been doing the stretches and has noticed significant improvement. Patient states that he still has some pain from time to time but overall as long as he does more biking and swimming he seems to do well. Patient states though at the end of long runs he does have the pain. Patient denies any pain while sleeping. He is not taking medication for the pain.  He continues to massage with the tennis ball. Not doing the stretches that he was doing previously which did help.patient did have manipulation done last time which did show some improvement as well.  Patient since last visit has had some calf tightness on that same side, right side on the lateral posterior aspect. Patient does not remember any injury he did not have any discoloration or any swelling. Patient states that it feels like it's the posterior knee but then seems to come tracking deep to his calf muscle. Patient denies any numbness or any weakness in the leg.  Review of Systems Negative for isolated injury. Negative for fever, chills, weight loss, fatigue. No dysuria or hematuria the    Objective:   Physical Exam GEN: well built/muscular male. NAD.  Back Exam:  Inspection:muscular, no scoliosis  Motion: Full ROM with no pain on flexion or extension  SLR seated:  Negative                     SLR lying: Negative  XSLR seated: Negative                    XSLR lying: Negative  Seated HS Flexibility: to mid shin.  Palpable tenderness: pain at R piriformis illicited with deep palpation and hip extension.  FABER: Mildly positive for pain with R hip extension, improve flexibility  Sensory change: none  Reflex change: none   Strength at  foot Plantar-flexion: 5 / 5    Dorsi-flexion: 5 / 5    Eversion: 5 / 5   Inversion:  5/ 5 Leg strength Quad:  5/ 5   Hamstring: 5 / 5   Hip flexor:  5/ 5   Hip abductors: 3 / 5 Which is improved from previous visit  Patient's lower extremity does show that patient has pain corresponding to the popliteus muscle. Patient has more pain with resisted plantarflexion.  neurovascularly intact with 2+ DTRs.Tender to palpation posterior superior aspect of the lateral gastroc head near the popliteus insertion just posterior and inferior to the fibular notch.  Patient psoas muscles are significantly tight bilaterally as well.  Osteopathic findings Sacrum and rotated left on left which is causing strain on his right piriformis muscle.    Assessment & Plan:

## 2011-12-09 ENCOUNTER — Ambulatory Visit: Payer: BC Managed Care – PPO | Admitting: Family Medicine

## 2012-07-20 ENCOUNTER — Ambulatory Visit: Payer: Self-pay | Admitting: Orthopaedic Surgery

## 2013-01-25 ENCOUNTER — Ambulatory Visit: Payer: Self-pay | Admitting: Family Medicine

## 2013-04-15 ENCOUNTER — Ambulatory Visit: Payer: Self-pay | Admitting: Gastroenterology

## 2014-04-10 ENCOUNTER — Ambulatory Visit: Payer: Self-pay | Admitting: Physician Assistant

## 2015-01-29 DIAGNOSIS — E7800 Pure hypercholesterolemia, unspecified: Secondary | ICD-10-CM | POA: Insufficient documentation

## 2015-10-26 ENCOUNTER — Encounter: Payer: Self-pay | Admitting: Emergency Medicine

## 2015-10-26 ENCOUNTER — Ambulatory Visit
Admission: EM | Admit: 2015-10-26 | Discharge: 2015-10-26 | Disposition: A | Discharge status: Home / Self Care | Payer: BLUE CROSS/BLUE SHIELD | Attending: Emergency Medicine | Admitting: Emergency Medicine

## 2015-10-26 DIAGNOSIS — J40 Bronchitis, not specified as acute or chronic: Secondary | ICD-10-CM | POA: Diagnosis not present

## 2015-10-26 DIAGNOSIS — J0110 Acute frontal sinusitis, unspecified: Secondary | ICD-10-CM | POA: Diagnosis not present

## 2015-10-26 MED ORDER — AMOXICILLIN-POT CLAVULANATE 875-125 MG PO TABS: 1.0000 | ORAL_TABLET | Freq: Two times a day (BID) | ORAL | 0 refills | Status: DC

## 2015-10-26 MED ORDER — BENZONATATE 100 MG PO CAPS: 100.0000 mg | ORAL_CAPSULE | Freq: Three times a day (TID) | ORAL | 0 refills | Status: DC | PRN

## 2015-10-26 MED ORDER — HYDROCOD POLST-CPM POLST ER 10-8 MG/5ML PO SUER: 5.0000 mL | Freq: Every evening | ORAL | 0 refills | Status: DC | PRN

## 2015-10-26 NOTE — Discharge Instructions (Signed)
Take medication as prescribed. Rest. Drink plenty of fluids.  ° °Follow up with your primary care physician this week as needed. Return to Urgent care for new or worsening concerns.  ° °

## 2015-10-26 NOTE — ED Provider Notes (Signed)
[facility] ____________________________________________  Time seen: Approximately 9:15 AM  I have reviewed the triage vital signs and the nursing notes.   HISTORY  Chief Complaint Cough   HPI Bobby Pace is a 55 y.o. male presents with complaints of 1.5 weeks of runny nose, nasal congestion and cough. Patient reports over the last week he has had more sinus congestion and sinus pressure around his forehead and cheeks on both sides. Patient states that his sinuses feel clogged. Patient reports he's felt like the last few days the chest congestion has improved but has continued with the sinus congestion. Patient reports that the cough is occasionally productive of thin whitish mucus. Patient reports some postnasal drainage. Patient reports cough is worse at night with postnasal drainage. Denies fevers. Patient states he felt like initially his symptoms were related to seasonal allergies.  Reports continues to eat and drink well. Reports symptoms unresolved with over-the-counter Mucinex and congestion medications.  Denies chest pain, shortness of breath, abdominal pain, chest pain with deep breath, extremity pain, extremity swelling, dizziness, weakness, vision changes, headaches or other complaints. Denies recent sickness or recent hospitalizations.  Dion Body, MD PCP  History reviewed. No pertinent past medical history.  Patient Active Problem List   Diagnosis Date Noted  . Popliteal pain 11/04/2011  . Hip pain, right 10/15/2011  . Nonallopathic lesion of lumbosacral region 10/15/2011  . HIP PAIN, RIGHT 04/11/2010  . 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 ANKLE PAIN, RIGHT 06/21/2009  . TENDINITIS TIBIALIS 06/21/2009  . 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 ABNORMALITY OF GAIT 06/21/2009    History reviewed. No pertinent surgical history.    No current facility-administered medications for this encounter.   Current Outpatient  Prescriptions:  .  amoxicillin-clavulanate (AUGMENTIN) 875-125 MG tablet, Take 1 tablet by mouth every 12 (twelve) hours., Disp: 20 tablet, Rfl: 0 .  benzonatate (TESSALON PERLES) 100 MG capsule, Take 1 capsule (100 mg total) by mouth 3 (three) times daily as needed for cough., Disp: 15 capsule, Rfl: 0 .  chlorpheniramine-HYDROcodone (TUSSIONEX PENNKINETIC ER) 10-8 MG/5ML SUER, Take 5 mLs by mouth at bedtime as needed. do not drive or operate machinery while taking as can cause drowsiness., Disp: 115 mL, Rfl: 0  Allergies Review of patient's allergies indicates no known allergies.  History reviewed. No pertinent family history.  Social History Social History  Substance Use Topics  . Smoking status: Never Smoker  . Smokeless tobacco: Never Used  . Alcohol use Yes    Review of Systems Constitutional: No fever/chills Eyes: No visual changes. ENT: No sore throat.As above. Cardiovascular: Denies chest pain. Respiratory: Denies shortness of breath. Gastrointestinal: No abdominal pain.  No nausea, no vomiting.  No diarrhea.  No constipation. Genitourinary: Negative for dysuria. Musculoskeletal: Negative for back pain. Skin: Negative for rash. Neurological: Negative for headaches, focal weakness or numbness.  10-point ROS otherwise negative.  ____________________________________________   PHYSICAL EXAM:  VITAL SIGNS: ED Triage Vitals  Enc Vitals Group     BP 10/26/15 0857 (!) 157/83     Pulse Rate 10/26/15 0857 61     Resp 10/26/15 0857 16     Temp 10/26/15 0857 97.8 F (36.6 C)     Temp Source 10/26/15 0857 Oral     SpO2 10/26/15 0857 98 %     Weight --      Height --      Head Circumference --      Peak Flow --  Pain Score 10/26/15 0900 8     Pain Loc --      Pain Edu? --      Excl. in [place]? --     Constitutional: Alert and oriented. Well appearing and in no acute distress. Eyes: Conjunctivae are normal. PERRL. EOMI. Head: Atraumatic.Mild tenderness to palpation  bilateral frontal, no tenderness to bilateral maxillary sinuses. No swelling. No erythema.   Ears: no erythema, normal TMs bilaterally.   Nose: nasal congestion with bilateral nasal turbinate erythema and edema.   Mouth/Throat: Mucous membranes are moist.  Oropharynx non-erythematous.No tonsillar swelling or exudate.  Neck: No stridor.  No cervical spine tenderness to palpation. Hematological/Lymphatic/Immunilogical: No cervical lymphadenopathy. Cardiovascular: Normal rate, regular rhythm. Grossly normal heart sounds.  Good peripheral circulation. Respiratory: Normal respiratory effort.  No retractions. Lungs CTAB. No wheezes, rales or rhonchi. Good air movement. Occasional dry intermittent cough noted. Gastrointestinal: Soft and nontender. No distention. Normal Bowel sounds. No CVA tenderness. Musculoskeletal: No lower or upper extremity tenderness nor edema.  Bilateral pedal pulses equal and easily palpated. No cervical, thoracic or lumbar tenderness to palpation.  Neurologic:  Normal speech and language. No gross focal neurologic deficits are appreciated. No gait instability. Skin:  Skin is warm, dry and intact. No rash noted. Psychiatric: Mood and affect are normal. Speech and behavior are normal.  ___________________________________________   LABS (all labs ordered are listed, but only abnormal results are displayed)  Labs Reviewed - No data to display ____________________________________________   PROCEDURES Procedures    INITIAL IMPRESSION / ASSESSMENT AND PLAN / ED COURSE  Pertinent labs & imaging results that were available during my care of the patient were reviewed by me and considered in my medical decision making (see chart for details).   Very well-appearing patient. No acute distress. Presents for the complaints of cough, congestion, runny nose, sinus congestion and sinus pressure. Discussed with patient as lungs clear throughout and no focal area of consolidation  auscultated, will defer chest x-ray at this time and patient agrees to this. Suspect frontal sinusitis and bronchitis. Will treat with oral Augmentin. When necessary Tessalon Perles during the day and when necessary Tussionex at night. Encourage rest, fluids and PCP follow-up as needed. Discussed indication, risks and benefits of medications with patient.  Discussed follow up with Primary care physician this week. Discussed follow up and return parameters including no resolution or any worsening concerns. Patient verbalized understanding and agreed to plan.   ____________________________________________   FINAL CLINICAL IMPRESSION(S) / ED DIAGNOSES  Final diagnoses:  Acute frontal sinusitis, recurrence not specified  Bronchitis     Discharge Medication List as of 10/26/2015  9:22 AM    START taking these medications   Details  amoxicillin-clavulanate (AUGMENTIN) 875-125 MG tablet Take 1 tablet by mouth every 12 (twelve) hours., Starting Fri 10/26/2015, Normal    benzonatate (TESSALON PERLES) 100 MG capsule Take 1 capsule (100 mg total) by mouth 3 (three) times daily as needed for cough., Starting Fri 10/26/2015, Normal    chlorpheniramine-HYDROcodone (TUSSIONEX PENNKINETIC ER) 10-8 MG/5ML SUER Take 5 mLs by mouth at bedtime as needed. do not drive or operate machinery while taking as can cause drowsiness., Starting Fri 10/26/2015, Print        Note: This dictation was prepared with Dragon dictation along with smaller phrase technology. Any transcriptional errors that result from this process are unintentional.    Clinical Course      Marylene Land, NP 10/26/15 1055

## 2015-10-26 NOTE — ED Triage Notes (Signed)
Patient c/o cough and chest congestion, sinus congestion for over a week.  Patient denies fevers.

## 2017-06-29 ENCOUNTER — Observation Stay: Payer: BLUE CROSS/BLUE SHIELD | Admitting: Anesthesiology

## 2017-06-29 ENCOUNTER — Encounter
Admission: EM | Disposition: A | Discharge status: Home / Self Care | Payer: Self-pay | Source: Home / Self Care | Attending: Emergency Medicine

## 2017-06-29 ENCOUNTER — Other Ambulatory Visit: Payer: Self-pay

## 2017-06-29 ENCOUNTER — Observation Stay
Admission: EM | Admit: 2017-06-29 | Discharge: 2017-07-01 | Disposition: A | Discharge status: Home / Self Care | Payer: BLUE CROSS/BLUE SHIELD | Attending: Surgery | Admitting: Surgery

## 2017-06-29 ENCOUNTER — Ambulatory Visit (INDEPENDENT_AMBULATORY_CARE_PROVIDER_SITE_OTHER)
Admit: 2017-06-29 | Discharge: 2017-06-29 | Disposition: A | Discharge status: Home / Self Care | Payer: BLUE CROSS/BLUE SHIELD | Attending: Family Medicine | Admitting: Family Medicine

## 2017-06-29 ENCOUNTER — Encounter: Payer: Self-pay | Admitting: Surgery

## 2017-06-29 ENCOUNTER — Ambulatory Visit (INDEPENDENT_AMBULATORY_CARE_PROVIDER_SITE_OTHER)
Admission: EM | Admit: 2017-06-29 | Discharge: 2017-06-29 | Disposition: A | Discharge status: Other Acute Inpatient Hospital | Payer: BLUE CROSS/BLUE SHIELD | Source: Home / Self Care | Attending: Family Medicine | Admitting: Family Medicine

## 2017-06-29 ENCOUNTER — Encounter: Payer: Self-pay | Admitting: Emergency Medicine

## 2017-06-29 DIAGNOSIS — K3532 Acute appendicitis with perforation and localized peritonitis, without abscess: Principal | ICD-10-CM | POA: Diagnosis present

## 2017-06-29 DIAGNOSIS — K3533 Acute appendicitis with perforation and localized peritonitis, with abscess: Secondary | ICD-10-CM | POA: Diagnosis not present

## 2017-06-29 DIAGNOSIS — R1110 Vomiting, unspecified: Secondary | ICD-10-CM | POA: Diagnosis not present

## 2017-06-29 DIAGNOSIS — Z7982 Long term (current) use of aspirin: Secondary | ICD-10-CM | POA: Insufficient documentation

## 2017-06-29 DIAGNOSIS — I70209 Unspecified atherosclerosis of native arteries of extremities, unspecified extremity: Secondary | ICD-10-CM

## 2017-06-29 DIAGNOSIS — Z79899 Other long term (current) drug therapy: Secondary | ICD-10-CM | POA: Diagnosis not present

## 2017-06-29 DIAGNOSIS — K3589 Other acute appendicitis without perforation or gangrene: Secondary | ICD-10-CM | POA: Diagnosis not present

## 2017-06-29 DIAGNOSIS — K3580 Unspecified acute appendicitis: Secondary | ICD-10-CM

## 2017-06-29 DIAGNOSIS — R509 Fever, unspecified: Secondary | ICD-10-CM

## 2017-06-29 DIAGNOSIS — R1031 Right lower quadrant pain: Secondary | ICD-10-CM | POA: Diagnosis not present

## 2017-06-29 HISTORY — PX: LAPAROSCOPIC APPENDECTOMY: SHX408

## 2017-06-29 LAB — CBC WITH DIFFERENTIAL/PLATELET
BASOS PCT: 0 %
Basophils Absolute: 0.1 10*3/uL (ref 0–0.1)
EOS ABS: 0 10*3/uL (ref 0–0.7)
Eosinophils Relative: 0 %
HEMATOCRIT: 49.1 % (ref 40.0–52.0)
Hemoglobin: 17 g/dL (ref 13.0–18.0)
LYMPHS ABS: 1 10*3/uL (ref 1.0–3.6)
Lymphocytes Relative: 6 %
MCH: 31.5 pg (ref 26.0–34.0)
MCHC: 34.7 g/dL (ref 32.0–36.0)
MCV: 90.6 fL (ref 80.0–100.0)
MONOS PCT: 8 %
Monocytes Absolute: 1.6 10*3/uL — ABNORMAL HIGH (ref 0.2–1.0)
NEUTROS ABS: 16.1 10*3/uL — AB (ref 1.4–6.5)
Neutrophils Relative %: 86 %
Platelets: 203 10*3/uL (ref 150–440)
RBC: 5.42 MIL/uL (ref 4.40–5.90)
RDW: 12.2 % (ref 11.5–14.5)
WBC: 18.8 10*3/uL — AB (ref 3.8–10.6)

## 2017-06-29 LAB — COMPREHENSIVE METABOLIC PANEL
ALBUMIN: 4.5 g/dL (ref 3.5–5.0)
ALT: 32 U/L (ref 17–63)
AST: 23 U/L (ref 15–41)
Alkaline Phosphatase: 61 U/L (ref 38–126)
Anion gap: 10 (ref 5–15)
BILIRUBIN TOTAL: 1.8 mg/dL — AB (ref 0.3–1.2)
BUN: 15 mg/dL (ref 6–20)
CALCIUM: 9.2 mg/dL (ref 8.9–10.3)
CO2: 28 mmol/L (ref 22–32)
CREATININE: 0.99 mg/dL (ref 0.61–1.24)
Chloride: 97 mmol/L — ABNORMAL LOW (ref 101–111)
GFR calc Af Amer: >60 mL/min (ref >60)
GFR calc non Af Amer: >60 mL/min (ref >60)
GLUCOSE: 131 mg/dL — AB (ref 65–99)
Potassium: 4.2 mmol/L (ref 3.5–5.1)
SODIUM: 135 mmol/L (ref 135–145)
TOTAL PROTEIN: 8 g/dL (ref 6.5–8.1)

## 2017-06-29 LAB — URINALYSIS, COMPLETE (UACMP) WITH MICROSCOPIC
BILIRUBIN URINE: NEGATIVE
Bacteria, UA: NONE SEEN
Glucose, UA: NEGATIVE mg/dL
Ketones, ur: NEGATIVE mg/dL
Leukocytes, UA: NEGATIVE
Nitrite: NEGATIVE
PH: 5.5 (ref 5.0–8.0)
Protein, ur: 30 mg/dL — AB
SPECIFIC GRAVITY, URINE: 1.02 (ref 1.005–1.030)
Squamous Epithelial / LPF: NONE SEEN

## 2017-06-29 SURGERY — APPENDECTOMY, LAPAROSCOPIC
Anesthesia: General

## 2017-06-29 MED ORDER — BUPIVACAINE-EPINEPHRINE (PF) 0.5% -1:200000 IJ SOLN
INTRAMUSCULAR | Status: AC
  Filled 2017-06-29: qty 30

## 2017-06-29 MED ORDER — PIPERACILLIN-TAZOBACTAM 3.375 G IVPB
3.3750 g | Freq: Three times a day (TID) | INTRAVENOUS | Status: DC
  Administered 2017-06-30 – 2017-07-01 (×5): 3.375 g via INTRAVENOUS
  Filled 2017-06-29 (×5): qty 50

## 2017-06-29 MED ORDER — SUGAMMADEX SODIUM 500 MG/5ML IV SOLN
INTRAVENOUS | Status: DC | PRN
  Administered 2017-06-29: 200 mg via INTRAVENOUS

## 2017-06-29 MED ORDER — PIPERACILLIN-TAZOBACTAM 3.375 G IVPB 30 MIN
3.3750 g | Freq: Once | INTRAVENOUS | Status: AC
  Administered 2017-06-29: 3.375 g via INTRAVENOUS
  Filled 2017-06-29: qty 50

## 2017-06-29 MED ORDER — PROPOFOL 10 MG/ML IV BOLUS
INTRAVENOUS | Status: DC | PRN
  Administered 2017-06-29: 170 mg via INTRAVENOUS

## 2017-06-29 MED ORDER — BUPIVACAINE-EPINEPHRINE (PF) 0.5% -1:200000 IJ SOLN
INTRAMUSCULAR | Status: DC | PRN
  Administered 2017-06-29: 30 mL via PERINEURAL

## 2017-06-29 MED ORDER — MIDAZOLAM HCL 2 MG/2ML IJ SOLN
INTRAMUSCULAR | Status: DC | PRN
  Administered 2017-06-29: 2 mg via INTRAVENOUS

## 2017-06-29 MED ORDER — LACTATED RINGERS IV SOLN
INTRAVENOUS | Status: DC | PRN
  Administered 2017-06-29: 21:00:00 via INTRAVENOUS

## 2017-06-29 MED ORDER — FENTANYL CITRATE (PF) 100 MCG/2ML IJ SOLN
INTRAMUSCULAR | Status: AC
  Filled 2017-06-29: qty 2

## 2017-06-29 MED ORDER — ONDANSETRON HCL 4 MG/2ML IJ SOLN: 4.0000 mg | Freq: Once | INTRAMUSCULAR | Status: DC | PRN

## 2017-06-29 MED ORDER — DEXTROSE IN LACTATED RINGERS 5 % IV SOLN
INTRAVENOUS | Status: DC
  Administered 2017-06-29 (×2): via INTRAVENOUS
  Filled 2017-06-29 (×3): qty 1000

## 2017-06-29 MED ORDER — SODIUM CHLORIDE FLUSH 0.9 % IV SOLN
INTRAVENOUS | Status: AC
  Filled 2017-06-29: qty 3

## 2017-06-29 MED ORDER — ONDANSETRON 4 MG PO TBDP: 4.0000 mg | ORAL_TABLET | Freq: Four times a day (QID) | ORAL | Status: DC | PRN

## 2017-06-29 MED ORDER — MIDAZOLAM HCL 2 MG/2ML IJ SOLN
INTRAMUSCULAR | Status: AC
  Filled 2017-06-29: qty 2

## 2017-06-29 MED ORDER — SEVOFLURANE IN SOLN
RESPIRATORY_TRACT | Status: AC
  Filled 2017-06-29: qty 250

## 2017-06-29 MED ORDER — ROCURONIUM BROMIDE 100 MG/10ML IV SOLN: INTRAVENOUS | Status: DC | PRN

## 2017-06-29 MED ORDER — ONDANSETRON HCL 4 MG/2ML IJ SOLN: 4.0000 mg | Freq: Four times a day (QID) | INTRAMUSCULAR | Status: DC | PRN

## 2017-06-29 MED ORDER — ONDANSETRON HCL 4 MG/2ML IJ SOLN
INTRAMUSCULAR | Status: DC | PRN
  Administered 2017-06-29: 4 mg via INTRAVENOUS

## 2017-06-29 MED ORDER — BUPIVACAINE-EPINEPHRINE (PF) 0.25% -1:200000 IJ SOLN
INTRAMUSCULAR | Status: AC
  Filled 2017-06-29: qty 30

## 2017-06-29 MED ORDER — IOHEXOL 300 MG/ML  SOLN
100.0000 mL | Freq: Once | INTRAMUSCULAR | Status: AC | PRN
  Administered 2017-06-29: 100 mL via INTRAVENOUS

## 2017-06-29 MED ORDER — MORPHINE SULFATE (PF) 2 MG/ML IV SOLN: 2.0000 mg | INTRAVENOUS | Status: DC | PRN

## 2017-06-29 MED ORDER — DEXAMETHASONE SODIUM PHOSPHATE 10 MG/ML IJ SOLN
INTRAMUSCULAR | Status: AC
  Filled 2017-06-29: qty 1

## 2017-06-29 MED ORDER — ACETAMINOPHEN 10 MG/ML IV SOLN
INTRAVENOUS | Status: AC
  Filled 2017-06-29: qty 100

## 2017-06-29 MED ORDER — ACETAMINOPHEN 10 MG/ML IV SOLN
INTRAVENOUS | Status: DC | PRN
  Administered 2017-06-29: 1000 mg via INTRAVENOUS

## 2017-06-29 MED ORDER — MORPHINE SULFATE (PF) 4 MG/ML IV SOLN
4.0000 mg | Freq: Once | INTRAVENOUS | Status: AC
  Administered 2017-06-29: 4 mg via INTRAVENOUS
  Filled 2017-06-29: qty 1

## 2017-06-29 MED ORDER — FENTANYL CITRATE (PF) 100 MCG/2ML IJ SOLN
INTRAMUSCULAR | Status: DC | PRN
  Administered 2017-06-29 (×4): 50 ug via INTRAVENOUS

## 2017-06-29 MED ORDER — SUGAMMADEX SODIUM 200 MG/2ML IV SOLN
INTRAVENOUS | Status: AC
  Filled 2017-06-29: qty 2

## 2017-06-29 MED ORDER — IOPAMIDOL (ISOVUE-M 300) INJECTION 61%
30.0000 mL | Freq: Once | INTRAMUSCULAR | Status: AC | PRN
  Administered 2017-06-29: 30 mL via INTRATHECAL

## 2017-06-29 MED ORDER — FENTANYL CITRATE (PF) 100 MCG/2ML IJ SOLN
INTRAMUSCULAR | Status: AC
  Administered 2017-06-29: 25 ug via INTRAVENOUS
  Filled 2017-06-29: qty 2

## 2017-06-29 MED ORDER — DEXAMETHASONE SODIUM PHOSPHATE 10 MG/ML IJ SOLN
INTRAMUSCULAR | Status: DC | PRN
  Administered 2017-06-29: 10 mg via INTRAVENOUS

## 2017-06-29 MED ORDER — CHLORHEXIDINE GLUCONATE CLOTH 2 % EX PADS: 6.0000 | MEDICATED_PAD | Freq: Once | CUTANEOUS | Status: DC

## 2017-06-29 MED ORDER — ROCURONIUM BROMIDE 100 MG/10ML IV SOLN
INTRAVENOUS | Status: DC | PRN
  Administered 2017-06-29: 50 mg via INTRAVENOUS
  Administered 2017-06-29: 30 mg via INTRAVENOUS

## 2017-06-29 MED ORDER — FENTANYL CITRATE (PF) 100 MCG/2ML IJ SOLN
25.0000 ug | INTRAMUSCULAR | Status: DC | PRN
  Administered 2017-06-29 (×4): 25 ug via INTRAVENOUS

## 2017-06-29 SURGICAL SUPPLY — 51 items
APPLICATOR ARISTA FLEXITIP XL (MISCELLANEOUS) ×3 IMPLANT
BULB RESERV EVAC DRAIN JP 100C (MISCELLANEOUS) ×3 IMPLANT
CANISTER SUCT 1200ML W/VALVE (MISCELLANEOUS) ×3 IMPLANT
CHLORAPREP W/TINT 26ML (MISCELLANEOUS) ×3 IMPLANT
CUTTER FLEX LINEAR 45M (STAPLE) ×3 IMPLANT
DERMABOND ADVANCED (GAUZE/BANDAGES/DRESSINGS) ×2
DERMABOND ADVANCED .7 DNX12 (GAUZE/BANDAGES/DRESSINGS) ×1 IMPLANT
DRAIN CHANNEL JP 19F (MISCELLANEOUS) ×3 IMPLANT
DRSG OPSITE POSTOP 3X4 (GAUZE/BANDAGES/DRESSINGS) ×3 IMPLANT
DRSG TEGADERM 4X4.75 (GAUZE/BANDAGES/DRESSINGS) ×3 IMPLANT
ELECT CAUTERY BLADE 6.4 (BLADE) ×3 IMPLANT
ELECT REM PT RETURN 9FT ADLT (ELECTROSURGICAL) ×3
ELECTRODE REM PT RTRN 9FT ADLT (ELECTROSURGICAL) ×1 IMPLANT
GAUZE SPONGE 4X4 12PLY STRL (GAUZE/BANDAGES/DRESSINGS) ×3 IMPLANT
GLOVE SURG SYN 7.0 (GLOVE) ×6 IMPLANT
GLOVE SURG SYN 7.5  E (GLOVE) ×4
GLOVE SURG SYN 7.5 E (GLOVE) ×2 IMPLANT
GOWN STRL REUS W/ TWL LRG LVL3 (GOWN DISPOSABLE) ×2 IMPLANT
GOWN STRL REUS W/TWL LRG LVL3 (GOWN DISPOSABLE) ×4
HEMOSTAT ARISTA ABSORB 1G (MISCELLANEOUS) ×3 IMPLANT
IRRIGATION STRYKERFLOW (MISCELLANEOUS) ×1 IMPLANT
IRRIGATOR STRYKERFLOW (MISCELLANEOUS) ×3
IV NS 1000ML (IV SOLUTION) ×2
IV NS 1000ML BAXH (IV SOLUTION) ×1 IMPLANT
KIT TURNOVER KIT A (KITS) ×3 IMPLANT
LABEL OR SOLS (LABEL) IMPLANT
LIGASURE LAP MARYLAND 5MM 37CM (ELECTROSURGICAL) IMPLANT
NEEDLE HYPO 22GX1.5 SAFETY (NEEDLE) ×3 IMPLANT
NS IRRIG 500ML POUR BTL (IV SOLUTION) ×3 IMPLANT
PACK LAP CHOLECYSTECTOMY (MISCELLANEOUS) ×3 IMPLANT
PENCIL ELECTRO HAND CTR (MISCELLANEOUS) ×3 IMPLANT
POUCH SPECIMEN RETRIEVAL 10MM (ENDOMECHANICALS) ×3 IMPLANT
RELOAD 45 VASCULAR/THIN (ENDOMECHANICALS) IMPLANT
RELOAD STAPLE TA45 3.5 REG BLU (ENDOMECHANICALS) ×6 IMPLANT
SCISSORS METZENBAUM CVD 33 (INSTRUMENTS) IMPLANT
SEALER TISSUE G2 CVD JAW 35 (ENDOMECHANICALS) ×1 IMPLANT
SEALER TISSUE G2 CVD JAW 45CM (ENDOMECHANICALS) ×2
SLEEVE ADV FIXATION 5X100MM (TROCAR) ×3 IMPLANT
SPONGE DRAIN TRACH 4X4 STRL 2S (GAUZE/BANDAGES/DRESSINGS) ×6 IMPLANT
STAPLER SKIN PROX 35W (STAPLE) ×3 IMPLANT
SUT ETHILON 3-0 FS-10 30 BLK (SUTURE) ×3
SUT MNCRL 4-0 (SUTURE) ×2
SUT MNCRL 4-0 27XMFL (SUTURE) ×1
SUT VICRYL 0 AB UR-6 (SUTURE) ×3 IMPLANT
SUTURE EHLN 3-0 FS-10 30 BLK (SUTURE) ×1 IMPLANT
SUTURE MNCRL 4-0 27XMF (SUTURE) ×1 IMPLANT
TRAY FOLEY W/METER SILVER 16FR (SET/KITS/TRAYS/PACK) ×3 IMPLANT
TROCAR BALLN GELPORT 12X130M (ENDOMECHANICALS) ×3 IMPLANT
TROCAR Z-THREAD OPTICAL 5X100M (TROCAR) ×3 IMPLANT
TUBING INSUFFLATION (TUBING) ×3 IMPLANT
TUBING SCD EXTENSION 9995 GYN (TUBING) ×3 IMPLANT

## 2017-06-29 NOTE — ED Triage Notes (Signed)
Pt brought by [facility] from [facility]. 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 Pt stating pain started on Saturday in RLQ. Pt last ate Saturday night. Pt stating he has had n/v with last emesis yesterday. Last BM was Saturday.

## 2017-06-29 NOTE — ED Triage Notes (Signed)
Patient in today c/o lower abdominal pain x 2 days. Patient has had nausea without emesis. Patient denies diarrhea. Patient hasn't checked temperature, but has been clammy.

## 2017-06-29 NOTE — ED Provider Notes (Signed)
[facility]  CSN: [id number] Arrival date & time: 06/29/17  1008  History   Chief Complaint Chief Complaint  Patient presents with  . Abdominal Pain   HPI  57 year old male presents with abdominal pain.  Patient reports that this started on Saturday.  He reports lower abdominal pain/cramping.  Associated bloating.  He has had nausea and a small amount of emesis.  No diarrhea.  His pain is predominantly in the right lower quadrant.  Patient endorses subjective fever and chills.  He has not taken his temperature.  Pain exacerbated with activity.  No relieving factors.  Pain is severe.  No other associated symptoms.  No other complaints.  PMH: Patient Active Problem List   Diagnosis Date Noted  . Popliteal pain 11/04/2011  . Hip pain, right 10/15/2011  . Nonallopathic lesion of lumbosacral region 10/15/2011  . HIP PAIN, RIGHT 04/11/2010  . 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 ANKLE PAIN, RIGHT 06/21/2009  . TENDINITIS TIBIALIS 06/21/2009  . 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 ABNORMALITY OF GAIT 06/21/2009   Past Surgical History:  Procedure Laterality Date  . HERNIA REPAIR     Home Medications    Prior to Admission medications   Not on File    Family History Family History  Problem Relation Age of Onset  . Healthy Mother   . Congestive Heart Failure Father   . Kidney disease Father     Social History Social History   Tobacco Use  . Smoking status: Never Smoker  . Smokeless tobacco: Never Used  Substance Use Topics  . Alcohol use: Yes    Comment: rarely  . Drug use: No     Allergies   Patient has no known allergies.   Review of Systems Review of Systems  Constitutional: Positive for chills and fever.  Gastrointestinal: Positive for abdominal pain, nausea and vomiting. Negative for diarrhea.   Physical Exam Triage Vital Signs ED Triage Vitals  Enc Vitals Group     BP 06/29/17 1024 (!) 148/94     Pulse Rate 06/29/17 1024 99   Resp 06/29/17 1024 16     Temp 06/29/17 1024 98.6 F (37 C)     Temp Source 06/29/17 1024 Oral     SpO2 06/29/17 1024 100 %     Weight 06/29/17 1025 215 lb (97.5 kg)     Height 06/29/17 1025 6' (1.829 m)     Head Circumference --      Peak Flow --      Pain Score 06/29/17 1024 7     Pain Loc --      Pain Edu? --      Excl. in [place]? --    Updated Vital Signs BP (!) 148/94 (BP Location: Right Arm)   Pulse 99   Temp 98.6 F (37 C) (Oral)   Resp 16   Ht 6' (1.829 m)   Wt 215 lb (97.5 kg)   SpO2 100%   BMI 29.16 kg/m   Physical Exam  Constitutional: He is oriented to person, place, and time. He appears well-developed. No distress.  HENT:  Head: Normocephalic and atraumatic.  Mouth/Throat: Oropharynx is clear and moist.  Cardiovascular: Normal rate and regular rhythm.  Pulmonary/Chest: Effort normal and breath sounds normal. He has no wheezes. He has no rales.  Abdominal:  Soft.  Exquisitely tender to palpation in the right lower quadrant.  Positive obturator sign.  Neurological: He is alert and oriented to person, place, and time.  Psychiatric: He has a normal mood and affect. His behavior is normal.  Nursing note and vitals reviewed.  UC Treatments / Results  Labs (all labs ordered are listed, but only abnormal results are displayed) Labs Reviewed  URINALYSIS, COMPLETE (UACMP) WITH MICROSCOPIC - Abnormal; Notable for the following components:      Result Value   Color, Urine AMBER (*)    Hgb urine dipstick SMALL (*)    Protein, ur 30 (*)    All other components within normal limits  CBC WITH DIFFERENTIAL/PLATELET - Abnormal; Notable for the following components:   WBC 18.8 (*)    Neutro Abs 16.1 (*)    Monocytes Absolute 1.6 (*)    All other components within normal limits  COMPREHENSIVE METABOLIC PANEL - Abnormal; Notable for the following components:   Chloride 97 (*)    Glucose, Bld 131 (*)    Total Bilirubin 1.8 (*)    All other components within normal limits     EKG None Radiology Ct Abdomen Pelvis W Contrast  Result Date: 06/29/2017 CLINICAL DATA:  Mid abdominal pain which is moved to the right lower quadrant beginning yesterday. Nausea and vomiting today. EXAM: CT ABDOMEN AND PELVIS WITH CONTRAST TECHNIQUE: Multidetector CT imaging of the abdomen and pelvis was performed using the standard protocol following bolus administration of intravenous contrast. CONTRAST:  100 ml OMNIPAQUE IOHEXOL 300 MG/ML  SOLN COMPARISON:  None. FINDINGS: Lower chest: Mild dependent atelectasis is seen in the lung bases. No pleural or pericardial effusion. Hepatobiliary: No focal liver abnormality is seen. No gallstones, gallbladder wall thickening, or biliary dilatation. Pancreas: Unremarkable. No pancreatic ductal dilatation or surrounding inflammatory changes. Spleen: Normal in size without focal abnormality. Adrenals/Urinary Tract: Adrenal glands are unremarkable. Kidneys are normal, without renal calculi, focal lesion, or hydronephrosis. Bladder is unremarkable. Stomach/Bowel: The appendix is dilated with extensive periappendiceal inflammatory stranding and a small volume of fluid about the appendix. There is also a small amount of free air adjacent to the appendix. The stomach and small and large bowel appear normal. Vascular/Lymphatic: Aortic atherosclerosis. No enlarged abdominal or pelvic lymph nodes. Reproductive: Prostate is unremarkable. Other: Trace amount of free pelvic fluid noted. Musculoskeletal: No acute abnormality. Scattered lumbar degenerative change is most notable at L4-5 and L5-S1. IMPRESSION: The exam is positive for acute appendicitis. A tiny amount of free air is seen adjacent to the appendix consistent with perforation. No other acute abnormality. Atherosclerosis. These results were called by telephone at the time of interpretation on 06/29/2017 at 1:21 pm to Dr. Thersa Salt , who verbally acknowledged these results. Electronically Signed   By: Inge Rise M.D.   On: 06/29/2017 13:23    Procedures Procedures (including critical care time)  Medications Ordered in UC Medications - No data to display   Initial Impression / Assessment and Plan / UC Course  I have reviewed the triage vital signs and the nursing notes.  Pertinent labs & imaging results that were available during my care of the patient were reviewed by me and considered in my medical decision making (see chart for details).     57 year old male presents with right lower quadrant pain.  We discussed CT imaging here versus in the ER.  Patient elected to do his imaging here.  Labs with leukocytosis.  Imaging with appendicitis with perforation.  Sending to the hospital via EMS.  Final Clinical Impressions(s) / UC Diagnoses   Final diagnoses:  Appendicitis, acute, with peritonitis    ED Discharge Orders  None     Controlled Substance Prescriptions [place] Controlled Substance Registry consulted? Not Applicable   Coral Spikes, DO 06/29/17 1336

## 2017-06-29 NOTE — H&P (Signed)
SURGICAL ADMISSION HISTORY & PHYSICAL  HISTORY OF PRESENT ILLNESS (HPI):  57 y.o. male presented to [facility] ED today after being referred from urgent care for management of acute appendicitis demonstrated on outpatient CT abdomen-pelvis imaging. Patient reports he first developed some vague abdominal discomfort Saturday night, which Sunday progressed to more severe lower abdominal pain and then became focal severe RLQ abdominal pain, for which an RN friend of his wife recommended evaluation for appendicitis. Patient reports multiple episodes of "dry heaves" with "cold sweats" without emesis or confirmed fevers and otherwise describes +flatus with +BM, denies any diarrhea, CP, or SOB.  Surgery is consulted by ED physician Dr. Burlene Arnt in this context for evaluation and management of perforated acute appendicitis without abscess.  PAST MEDICAL HISTORY (PMH):  No past medical history on file.   PAST SURGICAL HISTORY ([place]):       Past Surgical History:  Procedure Laterality Date  . HERNIA REPAIR Right 2007   Laparoscopic repair of Right inguinal hernia with mesh     MEDICATIONS:  Prior to Admission medications   Medication Sig Start Date End Date Taking? Authorizing Provider  aspirin EC 81 MG tablet Take 81 mg by mouth daily.   Yes [provider]  omega-3 acid ethyl esters (LOVAZA) 1 g capsule Take 1 g by mouth 2 (two) times daily.   Yes [provider]     ALLERGIES:  No Known Allergies   SOCIAL HISTORY:  Social History        Socioeconomic History  . Marital status: Married    Spouse name: Not on file  . Number of children: Not on file  . Years of education: Not on file  . Highest education level: Not on file  Occupational History  . Not on file  Social Needs  . Financial resource strain: Not on file  . Food insecurity:    Worry: Not on file    Inability: Not on file  . Transportation needs:    Medical: Not on file    Non-medical: Not  on file  Tobacco Use  . Smoking status: Never Smoker  . Smokeless tobacco: Never Used  Substance and Sexual Activity  . Alcohol use: Yes    Comment: rarely  . Drug use: No  . Sexual activity: Not on file  Lifestyle  . Physical activity:    Days per week: Not on file    Minutes per session: Not on file  . Stress: Not on file  Relationships  . Social connections:    Talks on phone: Not on file    Gets together: Not on file    Attends religious service: Not on file    Active member of club or organization: Not on file    Attends meetings of clubs or organizations: Not on file    Relationship status: Not on file  . Intimate partner violence:    Fear of current or ex partner: Not on file    Emotionally abused: Not on file    Physically abused: Not on file    Forced sexual activity: Not on file  Other Topics Concern  . Not on file  Social History Narrative  . Not on file    The patient currently resides (home / rehab facility / nursing home): Home The patient normally is (ambulatory / bedbound): Ambulatory   FAMILY HISTORY:       Family History  Problem Relation Age of Onset  . Healthy Mother   . Congestive Heart  Failure Father   . Kidney disease Father      REVIEW OF SYSTEMS:  Constitutional: denies weight loss, fever, chills, or sweats  Eyes: denies any other vision changes, history of eye injury  ENT: denies sore throat, hearing problems  Respiratory: denies shortness of breath, wheezing  Cardiovascular: denies chest pain, palpitations  Gastrointestinal: abdominal pain, N/V, and bowel function as per HPI Genitourinary: denies burning with urination or urinary frequency Musculoskeletal: denies any other joint pains or cramps  Skin: denies any other rashes or skin discolorations  Neurological: denies any other headache, dizziness, weakness  Psychiatric: denies any other depression, anxiety   All other review of systems were negative    VITAL SIGNS:  Temp:  [98.6 F (37 C)-100 F (37.8 C)] 100 F (37.8 C) (04/15 1425) Pulse Rate:  [93-99] 93 (04/15 1431) Resp:  [16-18] 18 (04/15 1425) BP: (148)/(94) 148/94 (04/15 1024) SpO2:  [96 %-100 %] 96 % (04/15 1431) Weight:  [215 lb (97.5 kg)] 215 lb (97.5 kg) (04/15 1427)     Height: 6' (182.9 cm) Weight: 215 lb (97.5 kg) BMI (Calculated): 29.15   INTAKE/OUTPUT:  This shift: No intake/output data recorded.  Last 2 shifts: @IOLAST2SHIFTS @   PHYSICAL EXAM:  Constitutional:  -- Normal body habitus  -- Awake, alert, and oriented x3, no apparent distress Eyes:  -- Pupils equally round and reactive to light  -- No scleral icterus, B/L no occular discharge Ear, nose, throat: -- Neck is FROM WNL -- No jugular venous distension  Pulmonary:  -- No wheezes or rhales -- Equal breath sounds bilaterally -- Breathing non-labored at rest Cardiovascular:  -- S1, S2 present  -- No pericardial rubs  Gastrointestinal:  -- Abdomen soft and non-distended with focal RLQ abdominal tenderness to palpation, no guarding or rebound tenderness -- No abdominal masses appreciated, pulsatile or otherwise  Musculoskeletal and Integumentary:  -- Wounds or skin discoloration: None appreciated -- Extremities: B/L UE and LE FROM, hands and feet warm, no edema  Neurologic:  -- Motor function: Intact and symmetric -- Sensation: Intact and symmetric Psychiatric:  -- Mood and affect WNL  Labs:  CBC Latest Ref Rng & Units 06/29/2017  WBC 3.8 - 10.6 K/uL 18.8(H)  Hemoglobin 13.0 - 18.0 g/dL 17.0  Hematocrit 40.0 - 52.0 % 49.1  Platelets 150 - 440 K/uL 203   CMP Latest Ref Rng & Units 06/29/2017  Glucose 65 - 99 mg/dL 131(H)  BUN 6 - 20 mg/dL 15  Creatinine 0.61 - 1.24 mg/dL 0.99  Sodium 135 - 145 mmol/L 135  Potassium 3.5 - 5.1 mmol/L 4.2  Chloride 101 - 111 mmol/L 97(L)  CO2 22 - 32 mmol/L 28  Calcium 8.9 - 10.3 mg/dL 9.2  Total Protein 6.5 - 8.1 g/dL 8.0  Total Bilirubin 0.3 - 1.2  mg/dL 1.8(H)  Alkaline Phos 38 - 126 U/L 61  AST 15 - 41 U/L 23  ALT 17 - 63 U/L 32   Imaging studies:  CT Abdomen and Pelvis with Contrast (06/29/2017) - personally reviewed and discussed with patient and ED physician The exam is positive for acute appendicitis. A tiny amount of free air is seen adjacent to the appendix consistent with perforation. No other acute abnormality. Atherosclerosis.  Assessment/Plan: (ICD-10's: K35.32) 57 y.o. male with perforated acute appendicitis without abscess on imaging, complicated by leukocytosis.              - NPO, IV fluid              -  pain control as needed              - IV antibiotics (Zosyn ordered)             - all risks, benefits, and alternatives to appendectomy were discussed with the patient, all of his questions were answered to his expressed satisfaction, patient expresses he wishes to proceed, and informed consent was obtained.             - will plan for laparoscopic appendectomy pending OR availability (patient expresses understanding that Dr. Hampton Abbot will be performing appendectomy if OR not available until after 7 pm             - DVT prophylaxis  All of the above findings and recommendations were discussed with the patient, and all of patient's questions were answered to his expressed satisfaction.  Thank you for the opportunity to participate in this patient's care.   -- Marilynne Drivers Rosana Hoes, MD, [place]: [facility] - Partnering for exceptional care. Office: [phone]

## 2017-06-29 NOTE — ED Provider Notes (Signed)
[facility] Emergency Department Provider Note  ____________________________________________   I have reviewed the triage vital signs and the nursing notes. Where available I have reviewed prior notes and, if possible and indicated, outside hospital notes.    HISTORY  Chief Complaint Abdominal Pain    HPI Bobby Pace is a 57 y.o. male  is largely healthy, has had abdominal pain and dry heaves since Saturday, this is Monday.  He went to an urgent care was found to have a CT verified appendicitis with microperforation.  He is felt warm but not had definitive fevers she is only vomited twice but had multiple episodes of nausea.  He also has been n.p.o. since he had contrast at the urgent care.  Subjective fevers.  Pain is very focal right lower quadrant worse when he touches it or changes position.  Positive anorexia.  No diarrhea.  Nothing makes it better or worse aside from moving or touching it,   No past medical history on file.  Patient Active Problem List   Diagnosis Date Noted  . Popliteal pain 11/04/2011  . Hip pain, right 10/15/2011  . Nonallopathic lesion of lumbosacral region 10/15/2011  . HIP PAIN, RIGHT 04/11/2010  . 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 ANKLE PAIN, RIGHT 06/21/2009  . TENDINITIS TIBIALIS 06/21/2009  . 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 ABNORMALITY OF GAIT 06/21/2009    Past Surgical History:  Procedure Laterality Date  . HERNIA REPAIR      Prior to Admission medications   Not on File    Allergies Patient has no known allergies.  Family History  Problem Relation Age of Onset  . Healthy Mother   . Congestive Heart Failure Father   . Kidney disease Father     Social History Social History   Tobacco Use  . Smoking status: Never Smoker  . Smokeless tobacco: Never Used  Substance Use Topics  . Alcohol use: Yes    Comment: rarely  . Drug use: No    Review of Systems Constitutional:  Positive subjective fever Eyes: No visual changes. ENT: No sore throat. No stiff neck no neck pain Cardiovascular: Denies chest pain. Respiratory: Denies shortness of breath. Gastrointestinal:   +  vomiting.  No diarrhea.  No constipation. Genitourinary: Negative for dysuria. Musculoskeletal: Negative lower extremity swelling Skin: Negative for rash. Neurological: Negative for severe headaches, focal weakness or numbness.   ____________________________________________   PHYSICAL EXAM:  VITAL SIGNS: ED Triage Vitals  Enc Vitals Group     BP --      Pulse Rate 06/29/17 1425 98     Resp 06/29/17 1425 18     Temp 06/29/17 1425 100 F (37.8 C)     Temp Source 06/29/17 1425 Oral     SpO2 --      Weight 06/29/17 1427 215 lb (97.5 kg)     Height 06/29/17 1427 6' (1.829 m)     Head Circumference --      Peak Flow --      Pain Score 06/29/17 1426 5     Pain Loc --      Pain Edu? --      Excl. in [place]? --     Constitutional: Alert and oriented. Well appearing and in no acute distress. Eyes: Conjunctivae are normal Head: Atraumatic HEENT: No congestion/rhinnorhea. Mucous membranes are moist.  Oropharynx non-erythematous Neck:   Nontender with no meningismus, no masses, no stridor Cardiovascular: Normal rate, regular rhythm. Grossly normal  heart sounds.  Good peripheral circulation. Respiratory: Normal respiratory effort.  No retractions. Lungs CTAB. Abdominal: Soft and there is to palpation the right lower quadrant with involuntary guarding, the rest of the abdomen is soft and non-peritoneal Back:  There is no focal tenderness or step off.  there is no midline tenderness there are no lesions noted. there is no CVA tenderness Musculoskeletal: No lower extremity tenderness, no upper extremity tenderness. No joint effusions, no DVT signs strong distal pulses no edema Neurologic:  Normal speech and language. No gross focal neurologic deficits are appreciated.  Skin:  Skin is warm, dry  and intact. No rash noted. Psychiatric: Mood and affect are normal. Speech and behavior are normal.  ____________________________________________   LABS (all labs ordered are listed, but only abnormal results are displayed)  Labs Reviewed - No data to display  Pertinent labs  results that were available during my care of the patient were reviewed by me and considered in my medical decision making (see chart for details). ____________________________________________  EKG  I personally interpreted any EKGs ordered by me or triage  ____________________________________________  RADIOLOGY  Pertinent labs & imaging results that were available during my care of the patient were reviewed by me and considered in my medical decision making (see chart for details). If possible, patient and/or family made aware of any abnormal findings.  Ct Abdomen Pelvis W Contrast  Result Date: 06/29/2017 CLINICAL DATA:  Mid abdominal pain which is moved to the right lower quadrant beginning yesterday. Nausea and vomiting today. EXAM: CT ABDOMEN AND PELVIS WITH CONTRAST TECHNIQUE: Multidetector CT imaging of the abdomen and pelvis was performed using the standard protocol following bolus administration of intravenous contrast. CONTRAST:  100 ml OMNIPAQUE IOHEXOL 300 MG/ML  SOLN COMPARISON:  None. FINDINGS: Lower chest: Mild dependent atelectasis is seen in the lung bases. No pleural or pericardial effusion. Hepatobiliary: No focal liver abnormality is seen. No gallstones, gallbladder wall thickening, or biliary dilatation. Pancreas: Unremarkable. No pancreatic ductal dilatation or surrounding inflammatory changes. Spleen: Normal in size without focal abnormality. Adrenals/Urinary Tract: Adrenal glands are unremarkable. Kidneys are normal, without renal calculi, focal lesion, or hydronephrosis. Bladder is unremarkable. Stomach/Bowel: The appendix is dilated with extensive periappendiceal inflammatory stranding and a  small volume of fluid about the appendix. There is also a small amount of free air adjacent to the appendix. The stomach and small and large bowel appear normal. Vascular/Lymphatic: Aortic atherosclerosis. No enlarged abdominal or pelvic lymph nodes. Reproductive: Prostate is unremarkable. Other: Trace amount of free pelvic fluid noted. Musculoskeletal: No acute abnormality. Scattered lumbar degenerative change is most notable at L4-5 and L5-S1. IMPRESSION: The exam is positive for acute appendicitis. A tiny amount of free air is seen adjacent to the appendix consistent with perforation. No other acute abnormality. Atherosclerosis. These results were called by telephone at the time of interpretation on 06/29/2017 at 1:21 pm to Dr. Thersa Salt , who verbally acknowledged these results. Electronically Signed   By: Inge Rise M.D.   On: 06/29/2017 13:23   ____________________________________________    PROCEDURES  Procedure(s) performed: None  Procedures  Critical Care performed: None  ____________________________________________   INITIAL IMPRESSION / ASSESSMENT AND PLAN / ED COURSE  Pertinent labs & imaging results that were available during my care of the patient were reviewed by me and considered in my medical decision making (see chart for details).  Here with appendicitis, we are giving him into a biotics in the emergency department pain medication, will keep him  n.p.o. and he will be admitted I did talk to Dr. Rosana Hoes who agrees with management will come see patient    ____________________________________________   FINAL CLINICAL IMPRESSION(S) / ED DIAGNOSES  Final diagnoses:  Acute appendicitis, unspecified acute appendicitis type      This chart was dictated using voice recognition software.  Despite best efforts to proofread,  errors can occur which can change meaning.      Schuyler Amor, MD 06/29/17 [phone]

## 2017-06-29 NOTE — ED Notes (Signed)
Dr Davis at bedside.

## 2017-06-29 NOTE — Anesthesia Post-op Follow-up Note (Signed)
Anesthesia QCDR form completed.        

## 2017-06-29 NOTE — ED Notes (Signed)
Called [employer] for Prior Authorization. No Authorization required for CT Abd with Contrast. Scheduled here in [place] today. Tech advised.

## 2017-06-29 NOTE — ED Triage Notes (Signed)
Patient does state that he has been urinating every 2 hours since 2am Sunday morning.

## 2017-06-29 NOTE — Consult Note (Signed)
SURGICAL CONSULTATION NOTE (initial) - cpt: 94709  HISTORY OF PRESENT ILLNESS (HPI):  57 y.o. male presented to [facility] ED today after being referred from urgent care for management of acute appendicitis demonstrated on outpatient CT abdomen-pelvis imaging. Patient reports he first developed some vague abdominal discomfort Saturday night, which Sunday progressed to more severe lower abdominal pain and then became focal severe RLQ abdominal pain, for which an RN friend of his wife recommended evaluation for appendicitis. Patient reports multiple episodes of "dry heaves" with "cold sweats" without emesis or confirmed fevers and otherwise describes +flatus with +BM, denies any diarrhea, CP, or SOB.  Surgery is consulted by ED physician Dr. Burlene Arnt in this context for evaluation and management of perforated acute appendicitis without abscess.  PAST MEDICAL HISTORY (PMH):  No past medical history on file.   PAST SURGICAL HISTORY ([place]):  Past Surgical History:  Procedure Laterality Date  . HERNIA REPAIR Right 2007   Laparoscopic repair of Right inguinal hernia with mesh     MEDICATIONS:  Prior to Admission medications   Medication Sig Start Date End Date Taking? Authorizing Provider  aspirin EC 81 MG tablet Take 81 mg by mouth daily.   Yes [provider]  omega-3 acid ethyl esters (LOVAZA) 1 g capsule Take 1 g by mouth 2 (two) times daily.   Yes [provider]     ALLERGIES:  No Known Allergies   SOCIAL HISTORY:  Social History   Socioeconomic History  . Marital status: Married    Spouse name: Not on file  . Number of children: Not on file  . Years of education: Not on file  . Highest education level: Not on file  Occupational History  . Not on file  Social Needs  . Financial resource strain: Not on file  . Food insecurity:    Worry: Not on file    Inability: Not on file  . Transportation needs:    Medical: Not on file    Non-medical: Not on file  Tobacco  Use  . Smoking status: Never Smoker  . Smokeless tobacco: Never Used  Substance and Sexual Activity  . Alcohol use: Yes    Comment: rarely  . Drug use: No  . Sexual activity: Not on file  Lifestyle  . Physical activity:    Days per week: Not on file    Minutes per session: Not on file  . Stress: Not on file  Relationships  . Social connections:    Talks on phone: Not on file    Gets together: Not on file    Attends religious service: Not on file    Active member of club or organization: Not on file    Attends meetings of clubs or organizations: Not on file    Relationship status: Not on file  . Intimate partner violence:    Fear of current or ex partner: Not on file    Emotionally abused: Not on file    Physically abused: Not on file    Forced sexual activity: Not on file  Other Topics Concern  . Not on file  Social History Narrative  . Not on file    The patient currently resides (home / rehab facility / nursing home): Home The patient normally is (ambulatory / bedbound): Ambulatory   FAMILY HISTORY:  Family History  Problem Relation Age of Onset  . Healthy Mother   . Congestive Heart Failure Father   . Kidney disease Father  REVIEW OF SYSTEMS:  Constitutional: denies weight loss, fever, chills, or sweats  Eyes: denies any other vision changes, history of eye injury  ENT: denies sore throat, hearing problems  Respiratory: denies shortness of breath, wheezing  Cardiovascular: denies chest pain, palpitations  Gastrointestinal: abdominal pain, N/V, and bowel function as per HPI Genitourinary: denies burning with urination or urinary frequency Musculoskeletal: denies any other joint pains or cramps  Skin: denies any other rashes or skin discolorations  Neurological: denies any other headache, dizziness, weakness  Psychiatric: denies any other depression, anxiety   All other review of systems were negative   VITAL SIGNS:  Temp:  [98.6 F (37 C)-100 F (37.8  C)] 100 F (37.8 C) (04/15 1425) Pulse Rate:  [93-99] 93 (04/15 1431) Resp:  [16-18] 18 (04/15 1425) BP: (148)/(94) 148/94 (04/15 1024) SpO2:  [96 %-100 %] 96 % (04/15 1431) Weight:  [215 lb (97.5 kg)] 215 lb (97.5 kg) (04/15 1427)     Height: 6' (182.9 cm) Weight: 215 lb (97.5 kg) BMI (Calculated): 29.15   INTAKE/OUTPUT:  This shift: No intake/output data recorded.  Last 2 shifts: @IOLAST2SHIFTS @   PHYSICAL EXAM:  Constitutional:  -- Normal body habitus  -- Awake, alert, and oriented x3, no apparent distress Eyes:  -- Pupils equally round and reactive to light  -- No scleral icterus, B/L no occular discharge Ear, nose, throat: -- Neck is FROM WNL -- No jugular venous distension  Pulmonary:  -- No wheezes or rhales -- Equal breath sounds bilaterally -- Breathing non-labored at rest Cardiovascular:  -- S1, S2 present  -- No pericardial rubs  Gastrointestinal:  -- Abdomen soft and non-distended with focal RLQ abdominal tenderness to palpation, no guarding or rebound tenderness -- No abdominal masses appreciated, pulsatile or otherwise  Musculoskeletal and Integumentary:  -- Wounds or skin discoloration: None appreciated -- Extremities: B/L UE and LE FROM, hands and feet warm, no edema  Neurologic:  -- Motor function: Intact and symmetric -- Sensation: Intact and symmetric Psychiatric:  -- Mood and affect WNL  Labs:  CBC Latest Ref Rng & Units 06/29/2017  WBC 3.8 - 10.6 K/uL 18.8(H)  Hemoglobin 13.0 - 18.0 g/dL 17.0  Hematocrit 40.0 - 52.0 % 49.1  Platelets 150 - 440 K/uL 203   CMP Latest Ref Rng & Units 06/29/2017  Glucose 65 - 99 mg/dL 131(H)  BUN 6 - 20 mg/dL 15  Creatinine 0.61 - 1.24 mg/dL 0.99  Sodium 135 - 145 mmol/L 135  Potassium 3.5 - 5.1 mmol/L 4.2  Chloride 101 - 111 mmol/L 97(L)  CO2 22 - 32 mmol/L 28  Calcium 8.9 - 10.3 mg/dL 9.2  Total Protein 6.5 - 8.1 g/dL 8.0  Total Bilirubin 0.3 - 1.2 mg/dL 1.8(H)  Alkaline Phos 38 - 126 U/L 61  AST 15 - 41  U/L 23  ALT 17 - 63 U/L 32   Imaging studies:  CT Abdomen and Pelvis with Contrast (06/29/2017) - personally reviewed and discussed with patient and ED physician The exam is positive for acute appendicitis. A tiny amount of free air is seen adjacent to the appendix consistent with perforation. No other acute abnormality. Atherosclerosis.  Assessment/Plan: (ICD-10's: K35.32) 57 y.o. male with perforated acute appendicitis without abscess on imaging, complicated by leukocytosis.   - NPO, IV fluid   - pain control as needed   - IV antibiotics (Zosyn ordered)  - all risks, benefits, and alternatives to appendectomy were discussed with the patient, all of his questions were answered to  his expressed satisfaction, patient expresses he wishes to proceed, and informed consent was obtained.  - will plan for laparoscopic appendectomy pending OR availability (patient expresses understanding that Dr. Hampton Abbot will be performing appendectomy if OR not available until after 7 pm  - DVT prophylaxis  All of the above findings and recommendations were discussed with the patient, and all of patient's questions were answered to his expressed satisfaction.  Thank you for the opportunity to participate in this patient's care.   -- Marilynne Drivers Rosana Hoes, MD, [place]: [facility] - Partnering for exceptional care. Office: [phone]

## 2017-06-29 NOTE — Transfer of Care (Signed)
Immediate Anesthesia Transfer of Care Note  Patient: Delrick Dehart Sax  Procedure(s) Performed: APPENDECTOMY LAPAROSCOPIC (N/A )  Patient Location: PACU  Anesthesia Type:General  Level of Consciousness: awake and patient cooperative  Airway & Oxygen Therapy: Patient Spontanous Breathing and Patient connected to face mask oxygen  Post-op Assessment: Report given to RN and Post -op Vital signs reviewed and stable  Post vital signs: Reviewed and stable  Last Vitals:  Vitals Value Taken Time  BP 154/87 06/29/2017 10:57 PM  Temp    Pulse 98 06/29/2017 10:58 PM  Resp 19 06/29/2017 10:58 PM  SpO2 99 % 06/29/2017 10:58 PM  Vitals shown include unvalidated device data.  Last Pain:  Vitals:   06/29/17 1926  TempSrc:   PainSc: 0-No pain         Complications: No apparent anesthesia complications

## 2017-06-29 NOTE — Anesthesia Procedure Notes (Signed)
Procedure Name: Intubation Date/Time: 06/29/2017 9:15 PM Performed by: Lendon Colonel, CRNA Pre-anesthesia Checklist: Patient identified, Patient being monitored, Timeout performed, Emergency Drugs available and Suction available Patient Re-evaluated:Patient Re-evaluated prior to induction Oxygen Delivery Method: Circle system utilized Preoxygenation: Pre-oxygenation with 100% oxygen Induction Type: IV induction Ventilation: Mask ventilation without difficulty Laryngoscope Size: Miller and 2 Grade View: Grade I Tube type: Oral Tube size: 7.0 mm Number of attempts: 1 Airway Equipment and Method: Stylet Placement Confirmation: ETT inserted through vocal cords under direct vision,  positive ETCO2 and breath sounds checked- equal and bilateral Secured at: 21 cm Tube secured with: Tape Dental Injury: Teeth and Oropharynx as per pre-operative assessment

## 2017-06-29 NOTE — Anesthesia Preprocedure Evaluation (Addendum)
Anesthesia Evaluation  Patient identified by MRN, date of birth, ID band Patient awake    Reviewed: Allergy & Precautions, H&P , NPO status , Patient's Chart, lab work & pertinent test results, reviewed documented beta blocker date and time   Airway Mallampati: II  TM Distance: >3 FB Neck ROM: full    Dental  (+) Teeth Intact   Pulmonary neg pulmonary ROS,    Pulmonary exam normal        Cardiovascular Exercise Tolerance: Good negative cardio ROS Normal cardiovascular exam Rhythm:regular Rate:Normal     Neuro/Psych negative neurological ROS  negative psych ROS   GI/Hepatic negative GI ROS, Neg liver ROS,   Endo/Other  negative endocrine ROS  Renal/GU negative Renal ROS  negative genitourinary   Musculoskeletal   Abdominal   Peds  Hematology negative hematology ROS (+)   Anesthesia Other Findings No past medical history on file. Past Surgical History: 2007: HERNIA REPAIR; Right     Comment:  Laparoscopic repair of Right inguinal hernia with mesh BMI    Body Mass Index:  29.16 kg/m     Reproductive/Obstetrics negative OB ROS                             Anesthesia Physical Anesthesia Plan  ASA: II  Anesthesia Plan: General ETT   Post-op Pain Management:    Induction:   PONV Risk Score and Plan:   Airway Management Planned:   Additional Equipment:   Intra-op Plan:   Post-operative Plan:   Informed Consent: I have reviewed the patients History and Physical, chart, labs and discussed the procedure including the risks, benefits and alternatives for the proposed anesthesia with the patient or authorized representative who has indicated his/her understanding and acceptance.   Dental Advisory Given  Plan Discussed with: CRNA  Anesthesia Plan Comments:         Anesthesia Quick Evaluation

## 2017-06-29 NOTE — Op Note (Signed)
  Procedure Date:  06/29/2017  Pre-operative Diagnosis:  Ruptured appendicitis  Post-operative Diagnosis:  Ruptured appendicitis  Procedure:  Laparoscopic appendectomy  Surgeon:  Melvyn Neth, MD  Anesthesia:  General endotracheal  Estimated Blood Loss:  25 ml  Specimens:  appendix  Complications:  None  Indications for Procedure:  This is a 57 y.o. male who presents with abdominal pain and workup revealing perforated acute appendicitis.  The options of surgery versus observation were reviewed with the patient and/or family. The risks of bleeding, infection, recurrence of symptoms, negative laparoscopy, potential for an open procedure, bowel injury, abscess or infection, were all discussed with the patient and he was willing to proceed.  Description of Procedure: The patient was correctly identified in the preoperative area and brought into the operating room.  The patient was placed supine with VTE prophylaxis in place.  Appropriate time-outs were performed.  Anesthesia was induced and the patient was intubated.  Foley catheter was placed.  Appropriate antibiotics were infused.  The abdomen was prepped and draped in a sterile fashion. An infraumbilical incision was made. A cutdown technique was used to enter the abdominal cavity without injury, and a Hasson trocar was inserted.  Pneumoperitoneum was obtained with appropriate opening pressures.  Two 5-mm ports were placed in the suprapubic and left lateral positions under direct visualization.  The right lower quadrant was inspected and the appendix was identified and found to be acutely inflamed and ruptured with gangrene at the mid-body.  The appendix was carefully dissected bluntly and with the Enseal The base of the appendix was dissected out and divided with a standard load Endo GIA. The mesoappendix was divided using the EnSeal.  Bleeding from the mesoappendix was cauterized with the EnSeal. The appendix was placed in an Endocatch  bag and brought out through the umbilical incision.  The right lower quadrant was then inspected again revealing an intact staple line, no bleeding, and no bowel injury.  The area was thoroughly irrigated.  Arista was then spread in the right lower quadrant in raw areas.  A 19 Fr. Blake drain was introduced via the left lateral port and tip placed in the right lower quadrant and pelvis.  The 5 mm ports were removed under direct visualization and the Hasson trocar was removed.  The fascial opening was closed using 0 vicryl suture.  The drain was secured to the skin using 3-0 Nylon.  Local anesthetic was infused in all incisions and the incisions were closed with staples.  The wounds were cleaned and dressed with honeycomb dressing and 4x4 gauze with tegaderm for the drain.  Foley catheter was removed and the patient was emerged from anesthesia and extubated and brought to the recovery room for further management.  The patient tolerated the procedure well and all counts were correct at the end of the case.   Melvyn Neth, MD

## 2017-06-30 LAB — CBC WITH DIFFERENTIAL/PLATELET
Basophils Absolute: 0 10*3/uL (ref 0–0.1)
Basophils Relative: 0 %
EOS ABS: 0 10*3/uL (ref 0–0.7)
Eosinophils Relative: 0 %
HCT: 46.6 % (ref 40.0–52.0)
HEMOGLOBIN: 16.1 g/dL (ref 13.0–18.0)
LYMPHS ABS: 0.4 10*3/uL — AB (ref 1.0–3.6)
Lymphocytes Relative: 3 %
MCH: 31.7 pg (ref 26.0–34.0)
MCHC: 34.5 g/dL (ref 32.0–36.0)
MCV: 91.8 fL (ref 80.0–100.0)
MONOS PCT: 7 %
Monocytes Absolute: 1.1 10*3/uL — ABNORMAL HIGH (ref 0.2–1.0)
NEUTROS PCT: 90 %
Neutro Abs: 16 10*3/uL — ABNORMAL HIGH (ref 1.4–6.5)
Platelets: 188 10*3/uL (ref 150–440)
RBC: 5.08 MIL/uL (ref 4.40–5.90)
RDW: 11.8 % (ref 11.5–14.5)
WBC: 17.5 10*3/uL — AB (ref 3.8–10.6)

## 2017-06-30 LAB — BASIC METABOLIC PANEL
Anion gap: 7 (ref 5–15)
BUN: 15 mg/dL (ref 6–20)
CHLORIDE: 98 mmol/L — AB (ref 101–111)
CO2: 28 mmol/L (ref 22–32)
CREATININE: 1.03 mg/dL (ref 0.61–1.24)
Calcium: 8.8 mg/dL — ABNORMAL LOW (ref 8.9–10.3)
GFR calc Af Amer: >60 mL/min (ref >60)
GFR calc non Af Amer: >60 mL/min (ref >60)
GLUCOSE: 187 mg/dL — AB (ref 65–99)
POTASSIUM: 3.9 mmol/L (ref 3.5–5.1)
SODIUM: 133 mmol/L — AB (ref 135–145)

## 2017-06-30 MED ORDER — OXYCODONE HCL 5 MG PO TABS
5.0000 mg | ORAL_TABLET | ORAL | Status: DC | PRN
  Administered 2017-06-30: 5 mg via ORAL
  Administered 2017-06-30: 10 mg via ORAL
  Administered 2017-06-30: 5 mg via ORAL
  Administered 2017-07-01: 10 mg via ORAL
  Filled 2017-06-30: qty 1
  Filled 2017-06-30: qty 2
  Filled 2017-06-30: qty 1
  Filled 2017-06-30: qty 2

## 2017-06-30 MED ORDER — HYDROMORPHONE HCL 1 MG/ML IJ SOLN: 0.5000 mg | INTRAMUSCULAR | Status: DC | PRN

## 2017-06-30 NOTE — Progress Notes (Signed)
[place] Hospital Day(s): 0.   Post op day(s): 1 Day Post-Op.   Interval History: Patient seen and examined, no acute events or new complaints since laparoscopic appendectomy with placement of intraperitoneal drain overnight for perforated appendicitis. Patient reports much improved pain with very mild peri-incisional pain, denies N/V, fever/chills, CP, or SOB. He has also be tolerating regular diet and ambulating.  Review of Systems:  Constitutional: denies fever, chills  HEENT: denies cough or congestion  Respiratory: denies any shortness of breath  Cardiovascular: denies chest pain or palpitations  Gastrointestinal: abdominal pain, N/V, and bowel function as per interval history Genitourinary: denies burning with urination or urinary frequency Musculoskeletal: denies pain, decreased motor or sensation Integumentary: denies any other rashes or skin discolorations except post-surgical abdominal wounds Neurological: denies HA or vision/hearing changes   Vital signs in last 24 hours: [min-max] current  Temp:  [97.7 F (36.5 C)-100.4 F (38 C)] 97.8 F (36.6 C) (04/16 0442) Pulse Rate:  [78-100] 78 (04/16 0442) Resp:  [13-95] 18 (04/16 0442) BP: (117-154)/(73-94) 128/82 (04/16 0442) SpO2:  [95 %-100 %] 98 % (04/16 0442) Weight:  [215 lb (97.5 kg)-218 lb 9.6 oz (99.2 kg)] 218 lb 9.6 oz (99.2 kg) (04/16 0032)     Height: 6' (182.9 cm) Weight: 218 lb 9.6 oz (99.2 kg) BMI (Calculated): 29.64   Intake/Output this shift:  No intake/output data recorded.   Intake/Output last 2 shifts:  @IOLAST2SHIFTS @   Physical Exam:  Constitutional: alert, cooperative and no distress  HENT: normocephalic without obvious abnormality  Eyes: PERRL, EOM's grossly intact and symmetric  Neuro: CN II - XII grossly intact and symmetric without deficit  Respiratory: breathing non-labored at rest  Cardiovascular: regular rate and sinus rhythm  Gastrointestinal: soft and non-distended with  minimal peri-incisional tenderness to palpation, incisions well-approximated without surrounding erythema or purulent drainage except somewhat purulent fluid draining from well-secured surgically placed drain Musculoskeletal: UE and LE FROM, no edema or wounds, motor and sensation grossly intact, NT   Labs:  CBC Latest Ref Rng & Units 06/30/2017 06/29/2017  WBC 3.8 - 10.6 K/uL 17.5(H) 18.8(H)  Hemoglobin 13.0 - 18.0 g/dL 16.1 17.0  Hematocrit 40.0 - 52.0 % 46.6 49.1  Platelets 150 - 440 K/uL 188 203   CMP Latest Ref Rng & Units 06/30/2017 06/29/2017  Glucose 65 - 99 mg/dL 187(H) 131(H)  BUN 6 - 20 mg/dL 15 15  Creatinine 0.61 - 1.24 mg/dL 1.03 0.99  Sodium 135 - 145 mmol/L 133(L) 135  Potassium 3.5 - 5.1 mmol/L 3.9 4.2  Chloride 101 - 111 mmol/L 98(L) 97(L)  CO2 22 - 32 mmol/L 28 28  Calcium 8.9 - 10.3 mg/dL 8.8(L) 9.2  Total Protein 6.5 - 8.1 g/dL - 8.0  Total Bilirubin 0.3 - 1.2 mg/dL - 1.8(H)  Alkaline Phos 38 - 126 U/L - 61  AST 15 - 41 U/L - 23  ALT 17 - 63 U/L - 32   Imaging studies: No new pertinent imaging studies   Assessment/Plan: (ICD-10's: K35.32) 57 y.o. male with leukocytosis slightly decreased 1 Day Post-Op s/p laparoscopic appendectomy with placement of intraperitoneal drain for perforated acute appendicitis.   - pain control prn  - advance diet as tolerated  - check repeat CBC for WBC tomorrow morning  - continue IV antibiotic (Zosyn) x at least 24 hours + outpatient oral antibiotics  - medical management of comorbidities (home medications)  - DVT prophylaxis with ambulation encouraged  - discharge planning over next 24 - 48  hours  All of the above findings and recommendations were discussed with the patient and patient's wife, and all of patient's and family's questions were answered to their expressed satisfaction.  -- Marilynne Drivers Rosana Hoes, MD, [place]: [facility] - Partnering for exceptional care. Office:  [phone]

## 2017-07-01 LAB — CBC WITH DIFFERENTIAL/PLATELET
BASOS PCT: 0 %
Basophils Absolute: 0 10*3/uL (ref 0–0.1)
Eosinophils Absolute: 0 10*3/uL (ref 0–0.7)
Eosinophils Relative: 0 %
HEMATOCRIT: 40.7 % (ref 40.0–52.0)
Hemoglobin: 13.9 g/dL (ref 13.0–18.0)
LYMPHS ABS: 1.2 10*3/uL (ref 1.0–3.6)
LYMPHS PCT: 10 %
MCH: 31.6 pg (ref 26.0–34.0)
MCHC: 34.1 g/dL (ref 32.0–36.0)
MCV: 92.6 fL (ref 80.0–100.0)
MONO ABS: 1.2 10*3/uL — AB (ref 0.2–1.0)
MONOS PCT: 10 %
NEUTROS ABS: 9.7 10*3/uL — AB (ref 1.4–6.5)
Neutrophils Relative %: 80 %
Platelets: 167 10*3/uL (ref 150–440)
RBC: 4.4 MIL/uL (ref 4.40–5.90)
RDW: 11.9 % (ref 11.5–14.5)
WBC: 12.2 10*3/uL — ABNORMAL HIGH (ref 3.8–10.6)

## 2017-07-01 LAB — SURGICAL PATHOLOGY

## 2017-07-01 MED ORDER — OXYCODONE HCL 5 MG PO TABS: 5.0000 mg | ORAL_TABLET | ORAL | 0 refills | Status: DC | PRN

## 2017-07-01 MED ORDER — AMOXICILLIN-POT CLAVULANATE 875-125 MG PO TABS: 1.0000 | ORAL_TABLET | Freq: Two times a day (BID) | ORAL | 0 refills | Status: AC

## 2017-07-01 NOTE — Discharge Instructions (Signed)
In addition to included general post-operative instructions for Laparoscopic Appendectomy for Perforated Appendicitis,  Diet: Resume home heart healthy diet.   Activity: No heavy lifting >20 pounds (children, pets, laundry, garbage) or strenuous activity until follow-up, but light activity and walking are encouraged. Do not drive or drink alcohol if taking narcotic pain medications.  Wound care: 2 days after surgery (Thursday, 4/18), you may shower/get incision wet with soapy water and pat dry (do not rub incisions), but no baths or submerging incision underwater until follow-up.   Medications: Resume all home medications. For mild to moderate pain: acetaminophen (Tylenol) or ibuprofen/naproxen (if no kidney disease). Combining Tylenol with alcohol can substantially increase your risk of causing liver disease. Narcotic pain medications, if prescribed, can be used for severe pain, though may cause nausea, constipation, and drowsiness. Do not combine Tylenol and Percocet (or similar) within a 6 hour period as Percocet (and similar) contain(s) Tylenol. If you do not need the narcotic pain medication, you do not need to fill the prescription.  Call office [phone]) at any time if any questions, worsening pain, fevers/chills, bleeding, drainage from incision site, or other concerns.

## 2017-07-01 NOTE — Anesthesia Postprocedure Evaluation (Signed)
Anesthesia Post Note  Patient: Bobby Pace  Procedure(s) Performed: APPENDECTOMY LAPAROSCOPIC (N/A )  Patient location during evaluation: PACU Anesthesia Type: General Level of consciousness: awake and alert Pain management: pain level controlled Vital Signs Assessment: post-procedure vital signs reviewed and stable Respiratory status: spontaneous breathing, nonlabored ventilation, respiratory function stable and patient connected to nasal cannula oxygen Cardiovascular status: blood pressure returned to baseline and stable Postop Assessment: no apparent nausea or vomiting Anesthetic complications: no     Last Vitals:  Vitals:   06/30/17 1959 07/01/17 0502  BP: 127/81 (!) 146/79  Pulse: 80 86  Resp: 20 20  Temp: 37.2 C 37 C  SpO2: 98% 96%    Last Pain:  Vitals:   07/01/17 0748  TempSrc:   PainSc: [name]

## 2017-07-01 NOTE — Progress Notes (Signed)
IV was removed. Discharge instructions, follow-up appointments, and prescriptions were provided to the pt. All questions answered. The pt is waiting for his wife to arrive for transport.

## 2017-07-04 NOTE — Discharge Summary (Signed)
Physician Discharge Summary  Patient ID: Bobby Pace MRN: [id number] DOB/AGE: January 08, 1961 57 y.o.  Admit date: 06/29/2017 Discharge date: 07/01/2017  Admission Diagnoses:  Discharge Diagnoses:  Active Problems:   Acute appendicitis with rupture   Discharged Condition: good  Hospital Course: 57 y.o. male presented to [facility] ED for abdominal pain. Workup was found to be significant for CT imaging demonstrating acute perforated appendicitis without abscess. Informed consent was obtained and documented, and patient underwent laparoscopic appendectomy with placement of drain ([place], 06/29/2017).  Post-operatively, patient's pain improved/resolved and advancement of patient's diet and ambulation were well-tolerated. Patient was kept for two additional day's worth of IV antibiotics and until normalization of patient's WBC. The remainder of patient's hospital course was essentially unremarkable, and discharge planning was initiated accordingly with patient safely able to be discharged home with appropriate discharge instructions, antibiotics, pain control, and outpatient surgical follow-up after all of his and family's questions were answered to their expressed satisfaction.  Consults: None  Significant Diagnostic Studies: radiology: CT scan: perforated appendicitis without abscess  Treatments: IV hydration, antibiotics: Zosyn and surgery: laparoscopic appendectomy with placement of drain ([place], 06/29/2017)  Discharge Exam: Blood pressure (!) 143/89, pulse 83, temperature 98.5 F (36.9 C), temperature source Oral, resp. rate 20, height 6' (1.829 m), weight 218 lb 9.6 oz (99.2 kg), SpO2 99 %. General appearance: alert, cooperative and no distress GI: abdomen soft and non-distended with minimal peri-incisional abdominal pain, incisions well-apprroximated without erythema or drainage except somewhat purulent drainage from surgically placed drain  Disposition:    Allergies as of  07/01/2017   No Known Allergies     Medication List    TAKE these medications   amoxicillin-clavulanate 875-125 MG tablet Commonly known as:  AUGMENTIN Take 1 tablet by mouth 2 (two) times daily for 10 days.   aspirin EC 81 MG tablet Take 81 mg by mouth daily.   omega-3 acid ethyl esters 1 g capsule Commonly known as:  LOVAZA Take 1 g by mouth 2 (two) times daily.   oxyCODONE 5 MG immediate release tablet Commonly known as:  Oxy IR/ROXICODONE Take 1-2 tablets (5-10 mg total) by mouth every 4 (four) hours as needed for severe pain.      Follow-up Information    [facility] On 07/08/2017.   Specialty:  General Surgery Why:  Go at 3:15pm you will see Dr. Hampton Abbot. Contact information: [address] [place] [phone]          Signed: Vickie Epley 07/04/2017, 11:23 AM

## 2017-07-06 ENCOUNTER — Telehealth: Payer: Self-pay | Admitting: Surgery

## 2017-07-06 NOTE — Telephone Encounter (Signed)
Patient called a left a voicemail said he was having some drainage coming out of the sight and needed to know if he needed to come in and get it checked out, patient is due to come in on 07/08/17 this week and follow up. Please call patient and advise.

## 2017-07-06 NOTE — Telephone Encounter (Signed)
Left message for patient to return call.

## 2017-07-07 ENCOUNTER — Other Ambulatory Visit: Payer: Self-pay

## 2017-07-07 NOTE — Telephone Encounter (Signed)
Patients coming in tomorrow for his appointment.

## 2017-07-08 ENCOUNTER — Encounter: Payer: Self-pay | Admitting: Surgery

## 2017-07-08 ENCOUNTER — Ambulatory Visit: Payer: BLUE CROSS/BLUE SHIELD | Admitting: Surgery

## 2017-07-08 VITALS — BP 144/84 | HR 67 | Temp 97.8°F | Wt 214.0 lb

## 2017-07-08 DIAGNOSIS — Z09 Encounter for follow-up examination after completed treatment for conditions other than malignant neoplasm: Secondary | ICD-10-CM

## 2017-07-08 NOTE — Patient Instructions (Signed)
GENERAL POST-OPERATIVE PATIENT INSTRUCTIONS   WOUND CARE INSTRUCTIONS:  Keep a dry clean dressing on the wound if there is drainage. The initial bandage may be removed after 24 hours.  Once the wound has quit draining you may leave it open to air.  If clothing rubs against the wound or causes irritation and the wound is not draining you may cover it with a dry dressing during the daytime.  Try to keep the wound dry and avoid ointments on the wound unless directed to do so.  If the wound becomes bright red and painful or starts to drain infected material that is not clear, please contact your physician immediately.  If the wound is mildly pink and has a thick firm ridge underneath it, this is normal, and is referred to as a healing ridge.  This will resolve over the next 4-6 weeks.  BATHING: You may shower if you have been informed of this by your surgeon. However, Please do not submerge in a tub, hot tub, or pool until incisions are completely sealed or have been told by your surgeon that you may do so.  DIET:  You may eat any foods that you can tolerate.  It is a good idea to eat a high fiber diet and take in plenty of fluids to prevent constipation.  If you do become constipated you may want to take a mild laxative or take ducolax tablets on a daily basis until your bowel habits are regular.  Constipation can be very uncomfortable, along with straining, after recent surgery.  ACTIVITY:  You are encouraged to cough and deep breath or use your incentive spirometer if you were given one, every 15-30 minutes when awake.  This will help prevent respiratory complications and low grade fevers post-operatively if you had a general anesthetic.  You may want to hug a pillow when coughing and sneezing to add additional support to the surgical area, if you had abdominal or chest surgery, which will decrease pain during these times.  You are encouraged to walk and engage in light activity for the next two weeks.  You  should not lift more than 20 pounds, until 07/28/2017 as it could put you at increased risk for complications.  Twenty pounds is roughly equivalent to a plastic bag of groceries. At that time- Listen to your body when lifting, if you have pain when lifting, stop and then try again in a few days. Soreness after doing exercises or activities of daily living is normal as you get back in to your normal routine.  MEDICATIONS:  Try to take narcotic medications and anti-inflammatory medications, such as tylenol, ibuprofen, naprosyn, etc., with food.  This will minimize stomach upset from the medication.  Should you develop nausea and vomiting from the pain medication, or develop a rash, please discontinue the medication and contact your physician.  You should not drive, make important decisions, or operate machinery when taking narcotic pain medication.  SUNBLOCK Use sun block to incision area over the next year if this area will be exposed to sun. This helps decrease scarring and will allow you avoid a permanent darkened area over your incision.  QUESTIONS:  Please feel free to call our office if you have any questions, and we will be glad to assist you. [phone]    

## 2017-07-08 NOTE — Progress Notes (Signed)
07/08/2017  HPI: Patient is s/p laparoscopic appendectomy on 4/15.  He had ruptured appendicitis and a drain was left in place and wounds closed with staples.  He is on antibiotics and has a few more days left.  He is doing well, without worsening pain, nausea, or vomiting.  He's tolerating a diet.  He does report some discomfort from time to time, but mostly depending on specific movements that he does. His drain is putting about 10 cc per day.  Vital signs: BP (!) 144/84   Pulse 67   Temp 97.8 F (36.6 C) (Oral)   Wt 214 lb (97.1 kg)   BMI 29.02 kg/m    Physical Exam: Constitutional: No acute distress Abdomen: soft, nondistended, nontender, to palpation.  Incisions are clean, dry, intact with staples.  JP drain in place with serous fluid in bulb.  Assessment/Plan: 57 yo male s/p laparoscopic appendectomy  --JP drain removed at bedside without complications.  Site dressed with dry gauze and tape. --Staples removed without complications and changed for steri strips. --Reminded patient that he still has about 3 more weeks of no heavy lifting or pushing of no more than 10-15 lbs.  Will give him a note for work stating this. --he may follow up with [place] prn.   Melvyn Neth, [place]

## 2017-11-09 ENCOUNTER — Encounter: Payer: Self-pay | Admitting: Family Medicine

## 2018-03-05 ENCOUNTER — Other Ambulatory Visit: Payer: Self-pay

## 2018-03-05 ENCOUNTER — Encounter
Admission: RE | Admit: 2018-03-05 | Discharge: 2018-03-05 | Disposition: A | Discharge status: Home / Self Care | Payer: BLUE CROSS/BLUE SHIELD | Source: Ambulatory Visit | Attending: Surgery | Admitting: Surgery

## 2018-03-05 ENCOUNTER — Ambulatory Visit: Payer: Self-pay | Admitting: Surgery

## 2018-03-05 HISTORY — DX: Malignant (primary) neoplasm, unspecified: C80.1

## 2018-03-05 HISTORY — DX: Pneumonia, unspecified organism: J18.9

## 2018-03-05 HISTORY — DX: Cardiac murmur, unspecified: R01.1

## 2018-03-05 NOTE — H&P (Signed)
Subjective:   CC: inguinal hernia LEFT  HPI:  Bobby Pace is a 57 y.o. male who was referred by Dion Body, MD for evaluation of above. Symptoms were first noted 2 weeks ago. Pain is sharp and intermittent, confined to the left groin, without radiation.  Associated with some noticeable swelling in the area, exacerbated by running uphill and deep palpation  Patient has no symptoms of  chronic constipation.    Past Medical History:  has a past medical history of Borderline diabetes, Heart murmur, unspecified (2010), and Hyperlipidemia (2018).  Past Surgical History:       Past Surgical History:  Procedure Laterality Date  . APPENDECTOMY  06/29/2017  . HERNIA REPAIR  07/2005  . MOHS SURGERY  05/2015   Skin cancer removed from nose    Family History: family history includes Coronary Artery Disease (Blocked arteries around heart) in his father; Heart failure in his father; Hip fracture in his mother; Skin cancer in his father.  Social History:  reports that he has never smoked. He has never used smokeless tobacco. He reports previous alcohol use. He reports that he does not use drugs.  Current Medications: has a current medication list which includes the following prescription(s): aspirin, omega-3 fatty acids-fish oil, and red yeast rice.  Allergies:     Allergies as of 03/03/2018  . (No Known Allergies)    ROS:  A 15 point review of systems was performed and pertinent positives and negatives noted in HPI   Objective:   BP 148/83   Pulse 59   Temp 36.8 C (98.3 F) (Oral)   Ht 175.3 cm (5\' 9" )   Wt (!) 102.3 kg (225 lb 9.6 oz)   BMI 33.32 kg/m   Constitutional :  alert, appears stated age, cooperative and no distress  Lymphatics/Throat:  no asymmetry, masses, or scars  Respiratory:  clear to auscultation bilaterally  Cardiovascular:  regular rate and rhythm, S1, S2 normal, no murmur, click, rub or gallop and regular rate and rhythm   Gastrointestinal: soft, non-tender; bowel sounds normal; no masses,  no organomegaly. inguinal hernia noted.  very small but reducible on left with deep palpation  Musculoskeletal: Steady gait and movement  Skin: Cool and moist, visible surgical scars from prior lap right ing hernia repair and appy  Psychiatric: Normal affect, non-agitated, not confused       LABS:  n/a   RADS: n/a Assessment:       Left inguinal hernia [K40.90], reducible, initial  Plan:   1. Left inguinal hernia [K40.90]   Discussed the risk of surgery including recurrence, which can be up to 50% in the case of incisional or complex hernias, possible use of prosthetic materials (mesh) and the increased risk of mesh infxn if used, bleeding, chronic pain, post-op infxn, post-op SBO or ileus, and possible re-operation to address said risks. The risks of general anesthetic, if used, includes MI, CVA, sudden death or even reaction to anesthetic medications also discussed. Alternatives include continued observation.  Benefits include possible symptom relief, prevention of incarceration, strangulation, enlargement in size over time, and the risk of emergency surgery in the face of strangulation.   Typical post-op recovery time of 3-5 days with 4-6 weeks of activity restrictions were also discussed.  ED return precautions given for sudden increase in pain, size of hernia with accompanying fever, nausea, and/or vomiting.  The patient verbalized understanding and all questions were answered to the patient's satisfaction.   2. Patient has elected to proceed  with OPEN LEFT inguinal hernia repair. Procedure will be scheduled.  Written consent was obtained.

## 2018-03-05 NOTE — Patient Instructions (Signed)
  Your procedure is scheduled on: Thursday March 11, 2018 Report to Same Day Surgery 2nd floor [facility] Entrance-take elevator on left to 2nd floor.  Check in with surgery information desk.) To find out your arrival time, call [phone] 1:00-3:00 PM on Tuesday March 09, 2018  Remember: Instructions that are not followed completely may result in serious medical risk, up to and including death, or upon the discretion of your surgeon and anesthesiologist your surgery may need to be rescheduled.    __x__ 1. Do not eat food (including mints, candies, chewing gum) after midnight the night before your procedure. You may drink clear liquids up to 2 hours before you are scheduled to arrive at the hospital for your procedure.  Do not drink anything within 2 hours of your scheduled arrival to the hospital.  Approved clear liquids:  --Water or Apple juice without pulp  --Clear carbohydrate beverage such as Gatorade or Powerade  --Black Coffee or Clear Tea (No milk, no creamers, do not add anything to the coffee or tea)    __x__ 2. No Alcohol for 24 hours before or after surgery.   __x__ 3. No Smoking or e-cigarettes for 24 hours before surgery.  Do not use any chewable tobacco products for at least 6 hours before surgery.   __x__ 4. Notify your doctor if there is any change in your medical condition (cold, fever, infections).   __x__ 5. On the morning of surgery brush your teeth with toothpaste and water.  You may rinse your mouth with mouthwash if you wish.  Do not swallow any toothpaste or mouthwash.   Do not wear jewelry, lotions, powders, perfumes on the day of surgery.  Do not bring valuables to the hospital.    [facility] is not responsible for any belongings or valuables.    For patients discharged on the day of surgery, you will NOT be permitted to drive yourself home.  You must have a responsible adult with you for 24 hours after surgery.  __x__ Take  anti-hypertensive listed below, cardiac, seizure, asthma, anti-reflux and psychiatric medicines. These include:  1. None on the day of surgery  __x__ Follow recommendations from Cardiologist, Pulmonologist or PCP regarding stopping Aspirin, Coumadin, Plavix, Eliquis, Effient, Pradaxa, and Pletal.  __x__ TODAY: Stop anti-inflammatories such as Advil, Ibuprofen, Motrin, Aleve, Naproxen, Naprosyn, BC/Goodies powders or aspirin products. You may continue to take Tylenol and Celebrex.   __x__ TODAY: Stop supplements (Red Yeast Rice) until after surgery.   Reviewed via telephone with patient 03/05/2018 @ [phone]

## 2018-03-05 NOTE — H&P (View-Only) (Signed)
Subjective:   CC: inguinal hernia LEFT  HPI:  Bobby Pace is a 57 y.o. male who was referred by Dion Body, MD for evaluation of above. Symptoms were first noted 2 weeks ago. Pain is sharp and intermittent, confined to the left groin, without radiation.  Associated with some noticeable swelling in the area, exacerbated by running uphill and deep palpation  Patient has no symptoms of  chronic constipation.    Past Medical History:  has a past medical history of Borderline diabetes, Heart murmur, unspecified (2010), and Hyperlipidemia (2018).  Past Surgical History:       Past Surgical History:  Procedure Laterality Date  . APPENDECTOMY  06/29/2017  . HERNIA REPAIR  07/2005  . MOHS SURGERY  05/2015   Skin cancer removed from nose    Family History: family history includes Coronary Artery Disease (Blocked arteries around heart) in his father; Heart failure in his father; Hip fracture in his mother; Skin cancer in his father.  Social History:  reports that he has never smoked. He has never used smokeless tobacco. He reports previous alcohol use. He reports that he does not use drugs.  Current Medications: has a current medication list which includes the following prescription(s): aspirin, omega-3 fatty acids-fish oil, and red yeast rice.  Allergies:     Allergies as of 03/03/2018  . (No Known Allergies)    ROS:  A 15 point review of systems was performed and pertinent positives and negatives noted in HPI   Objective:   BP 148/83   Pulse 59   Temp 36.8 C (98.3 F) (Oral)   Ht 175.3 cm (5\' 9" )   Wt (!) 102.3 kg (225 lb 9.6 oz)   BMI 33.32 kg/m   Constitutional :  alert, appears stated age, cooperative and no distress  Lymphatics/Throat:  no asymmetry, masses, or scars  Respiratory:  clear to auscultation bilaterally  Cardiovascular:  regular rate and rhythm, S1, S2 normal, no murmur, click, rub or gallop and regular rate and rhythm   Gastrointestinal: soft, non-tender; bowel sounds normal; no masses,  no organomegaly. inguinal hernia noted.  very small but reducible on left with deep palpation  Musculoskeletal: Steady gait and movement  Skin: Cool and moist, visible surgical scars from prior lap right ing hernia repair and appy  Psychiatric: Normal affect, non-agitated, not confused       LABS:  n/a   RADS: n/a Assessment:       Left inguinal hernia [K40.90], reducible, initial  Plan:   1. Left inguinal hernia [K40.90]   Discussed the risk of surgery including recurrence, which can be up to 50% in the case of incisional or complex hernias, possible use of prosthetic materials (mesh) and the increased risk of mesh infxn if used, bleeding, chronic pain, post-op infxn, post-op SBO or ileus, and possible re-operation to address said risks. The risks of general anesthetic, if used, includes MI, CVA, sudden death or even reaction to anesthetic medications also discussed. Alternatives include continued observation.  Benefits include possible symptom relief, prevention of incarceration, strangulation, enlargement in size over time, and the risk of emergency surgery in the face of strangulation.   Typical post-op recovery time of 3-5 days with 4-6 weeks of activity restrictions were also discussed.  ED return precautions given for sudden increase in pain, size of hernia with accompanying fever, nausea, and/or vomiting.  The patient verbalized understanding and all questions were answered to the patient's satisfaction.   2. Patient has elected to proceed  with OPEN LEFT inguinal hernia repair. Procedure will be scheduled.  Written consent was obtained.

## 2018-03-10 ENCOUNTER — Encounter: Payer: Self-pay | Admitting: Anesthesiology

## 2018-03-10 MED ORDER — CEFAZOLIN SODIUM-DEXTROSE 2-4 GM/100ML-% IV SOLN
2.0000 g | INTRAVENOUS | Status: AC
  Administered 2018-03-11: 2 g via INTRAVENOUS

## 2018-03-11 ENCOUNTER — Encounter
Admission: RE | Disposition: A | Discharge status: Home / Self Care | Payer: Self-pay | Source: Home / Self Care | Attending: Surgery

## 2018-03-11 ENCOUNTER — Ambulatory Visit
Admission: RE | Admit: 2018-03-11 | Discharge: 2018-03-11 | Disposition: A | Discharge status: Home / Self Care | Payer: BLUE CROSS/BLUE SHIELD | Attending: Surgery | Admitting: Surgery

## 2018-03-11 ENCOUNTER — Other Ambulatory Visit: Payer: Self-pay

## 2018-03-11 ENCOUNTER — Ambulatory Visit: Payer: BLUE CROSS/BLUE SHIELD | Admitting: Anesthesiology

## 2018-03-11 DIAGNOSIS — K4091 Unilateral inguinal hernia, without obstruction or gangrene, recurrent: Secondary | ICD-10-CM | POA: Insufficient documentation

## 2018-03-11 DIAGNOSIS — Z7982 Long term (current) use of aspirin: Secondary | ICD-10-CM | POA: Insufficient documentation

## 2018-03-11 DIAGNOSIS — Z8249 Family history of ischemic heart disease and other diseases of the circulatory system: Secondary | ICD-10-CM | POA: Diagnosis not present

## 2018-03-11 DIAGNOSIS — D176 Benign lipomatous neoplasm of spermatic cord: Secondary | ICD-10-CM | POA: Diagnosis not present

## 2018-03-11 DIAGNOSIS — R7303 Prediabetes: Secondary | ICD-10-CM | POA: Insufficient documentation

## 2018-03-11 DIAGNOSIS — K4090 Unilateral inguinal hernia, without obstruction or gangrene, not specified as recurrent: Secondary | ICD-10-CM

## 2018-03-11 HISTORY — PX: INGUINAL HERNIA REPAIR: SHX194

## 2018-03-11 SURGERY — REPAIR, HERNIA, INGUINAL, ADULT
Anesthesia: General | Laterality: Left

## 2018-03-11 MED ORDER — DOCUSATE SODIUM 100 MG PO CAPS: 100.0000 mg | ORAL_CAPSULE | Freq: Two times a day (BID) | ORAL | 0 refills | Status: AC | PRN

## 2018-03-11 MED ORDER — LIDOCAINE HCL (PF) 2 % IJ SOLN
INTRAMUSCULAR | Status: AC
  Filled 2018-03-11: qty 10

## 2018-03-11 MED ORDER — PROPOFOL 10 MG/ML IV BOLUS
INTRAVENOUS | Status: AC
  Filled 2018-03-11: qty 20

## 2018-03-11 MED ORDER — MIDAZOLAM HCL 2 MG/2ML IJ SOLN
INTRAMUSCULAR | Status: AC
  Filled 2018-03-11: qty 2

## 2018-03-11 MED ORDER — BUPIVACAINE-EPINEPHRINE (PF) 0.5% -1:200000 IJ SOLN
INTRAMUSCULAR | Status: DC | PRN
  Administered 2018-03-11: 5 mL via PERINEURAL

## 2018-03-11 MED ORDER — ONDANSETRON HCL 4 MG/2ML IJ SOLN
INTRAMUSCULAR | Status: DC | PRN
  Administered 2018-03-11: 4 mg via INTRAVENOUS

## 2018-03-11 MED ORDER — KETOROLAC TROMETHAMINE 30 MG/ML IJ SOLN
INTRAMUSCULAR | Status: DC | PRN
  Administered 2018-03-11: 30 mg via INTRAVENOUS

## 2018-03-11 MED ORDER — CEFAZOLIN SODIUM-DEXTROSE 2-4 GM/100ML-% IV SOLN
INTRAVENOUS | Status: AC
  Filled 2018-03-11: qty 100

## 2018-03-11 MED ORDER — CELECOXIB 200 MG PO CAPS
200.0000 mg | ORAL_CAPSULE | ORAL | Status: AC
  Administered 2018-03-11: 200 mg via ORAL

## 2018-03-11 MED ORDER — FAMOTIDINE 20 MG PO TABS
20.0000 mg | ORAL_TABLET | Freq: Once | ORAL | Status: AC
  Administered 2018-03-11: 20 mg via ORAL

## 2018-03-11 MED ORDER — FENTANYL CITRATE (PF) 100 MCG/2ML IJ SOLN: 25.0000 ug | INTRAMUSCULAR | Status: DC | PRN

## 2018-03-11 MED ORDER — ONDANSETRON HCL 4 MG/2ML IJ SOLN: 4.0000 mg | Freq: Once | INTRAMUSCULAR | Status: DC | PRN

## 2018-03-11 MED ORDER — BUPIVACAINE LIPOSOME 1.3 % IJ SUSP
INTRAMUSCULAR | Status: AC
  Filled 2018-03-11: qty 20

## 2018-03-11 MED ORDER — IBUPROFEN 800 MG PO TABS: 800.0000 mg | ORAL_TABLET | Freq: Three times a day (TID) | ORAL | 0 refills | Status: AC | PRN

## 2018-03-11 MED ORDER — ONDANSETRON HCL 4 MG/2ML IJ SOLN
INTRAMUSCULAR | Status: AC
  Filled 2018-03-11: qty 2

## 2018-03-11 MED ORDER — BUPIVACAINE LIPOSOME 1.3 % IJ SUSP
INTRAMUSCULAR | Status: DC | PRN
  Administered 2018-03-11: 20 mL

## 2018-03-11 MED ORDER — PROPOFOL 10 MG/ML IV BOLUS
INTRAVENOUS | Status: DC | PRN
  Administered 2018-03-11: 200 mg via INTRAVENOUS

## 2018-03-11 MED ORDER — ACETAMINOPHEN 500 MG PO TABS
ORAL_TABLET | ORAL | Status: AC
  Administered 2018-03-11: 1000 mg via ORAL
  Filled 2018-03-11: qty 2

## 2018-03-11 MED ORDER — ACETAMINOPHEN 325 MG PO TABS: 650.0000 mg | ORAL_TABLET | Freq: Three times a day (TID) | ORAL | 0 refills | Status: AC | PRN

## 2018-03-11 MED ORDER — DEXAMETHASONE SODIUM PHOSPHATE 10 MG/ML IJ SOLN
INTRAMUSCULAR | Status: AC
  Filled 2018-03-11: qty 1

## 2018-03-11 MED ORDER — FAMOTIDINE 20 MG PO TABS
ORAL_TABLET | ORAL | Status: AC
  Administered 2018-03-11: 20 mg via ORAL
  Filled 2018-03-11: qty 1

## 2018-03-11 MED ORDER — DEXAMETHASONE SODIUM PHOSPHATE 10 MG/ML IJ SOLN
INTRAMUSCULAR | Status: DC | PRN
  Administered 2018-03-11: 8 mg via INTRAVENOUS

## 2018-03-11 MED ORDER — FENTANYL CITRATE (PF) 250 MCG/5ML IJ SOLN
INTRAMUSCULAR | Status: AC
  Filled 2018-03-11: qty 5

## 2018-03-11 MED ORDER — ACETAMINOPHEN 500 MG PO TABS
1000.0000 mg | ORAL_TABLET | ORAL | Status: AC
  Administered 2018-03-11: 1000 mg via ORAL

## 2018-03-11 MED ORDER — BUPIVACAINE-EPINEPHRINE (PF) 0.5% -1:200000 IJ SOLN
INTRAMUSCULAR | Status: AC
  Filled 2018-03-11: qty 30

## 2018-03-11 MED ORDER — CELECOXIB 200 MG PO CAPS
ORAL_CAPSULE | ORAL | Status: AC
  Administered 2018-03-11: 200 mg via ORAL
  Filled 2018-03-11: qty 1

## 2018-03-11 MED ORDER — HYDROCODONE-ACETAMINOPHEN 5-325 MG PO TABS: 1.0000 | ORAL_TABLET | Freq: Four times a day (QID) | ORAL | 0 refills | Status: AC | PRN

## 2018-03-11 MED ORDER — LIDOCAINE HCL (CARDIAC) PF 100 MG/5ML IV SOSY
PREFILLED_SYRINGE | INTRAVENOUS | Status: DC | PRN
  Administered 2018-03-11: 100 mg via INTRAVENOUS

## 2018-03-11 MED ORDER — GABAPENTIN 300 MG PO CAPS
ORAL_CAPSULE | ORAL | Status: AC
  Administered 2018-03-11: 300 mg via ORAL
  Filled 2018-03-11: qty 1

## 2018-03-11 MED ORDER — MIDAZOLAM HCL 2 MG/2ML IJ SOLN
INTRAMUSCULAR | Status: DC | PRN
  Administered 2018-03-11: 2 mg via INTRAVENOUS

## 2018-03-11 MED ORDER — LACTATED RINGERS IV SOLN
INTRAVENOUS | Status: DC
  Administered 2018-03-11: 07:00:00 via INTRAVENOUS

## 2018-03-11 MED ORDER — GABAPENTIN 300 MG PO CAPS
300.0000 mg | ORAL_CAPSULE | ORAL | Status: AC
  Administered 2018-03-11: 300 mg via ORAL

## 2018-03-11 MED ORDER — GLYCOPYRROLATE 0.2 MG/ML IJ SOLN
INTRAMUSCULAR | Status: DC | PRN
  Administered 2018-03-11: 0.2 mg via INTRAVENOUS

## 2018-03-11 MED ORDER — FENTANYL CITRATE (PF) 100 MCG/2ML IJ SOLN
INTRAMUSCULAR | Status: DC | PRN
  Administered 2018-03-11: 25 ug via INTRAVENOUS
  Administered 2018-03-11: 50 ug via INTRAVENOUS

## 2018-03-11 MED ORDER — CHLORHEXIDINE GLUCONATE CLOTH 2 % EX PADS: 6.0000 | MEDICATED_PAD | Freq: Once | CUTANEOUS | Status: DC

## 2018-03-11 SURGICAL SUPPLY — 35 items
BLADE CLIPPER SURG (BLADE) ×3 IMPLANT
BLADE SURG 15 STRL LF DISP TIS (BLADE) ×1 IMPLANT
BLADE SURG 15 STRL SS (BLADE) ×2
CANISTER SUCT 1200ML W/VALVE (MISCELLANEOUS) ×3 IMPLANT
CHLORAPREP W/TINT 26ML (MISCELLANEOUS) ×3 IMPLANT
COVER WAND RF STERILE (DRAPES) ×3 IMPLANT
DERMABOND ADVANCED (GAUZE/BANDAGES/DRESSINGS) ×2
DERMABOND ADVANCED .7 DNX12 (GAUZE/BANDAGES/DRESSINGS) ×1 IMPLANT
DRAIN PENROSE 1/4X12 LTX (DRAIN) ×3 IMPLANT
DRAPE LAPAROTOMY 100X77 ABD (DRAPES) ×3 IMPLANT
ELECT REM PT RETURN 9FT ADLT (ELECTROSURGICAL) ×3
ELECTRODE REM PT RTRN 9FT ADLT (ELECTROSURGICAL) ×1 IMPLANT
GLOVE BIOGEL PI IND STRL 7.0 (GLOVE) ×1 IMPLANT
GLOVE BIOGEL PI INDICATOR 7.0 (GLOVE) ×2
GLOVE SURG SYN 7.0 (GLOVE) ×6 IMPLANT
GOWN STRL REUS W/ TWL LRG LVL3 (GOWN DISPOSABLE) ×2 IMPLANT
GOWN STRL REUS W/TWL LRG LVL3 (GOWN DISPOSABLE) ×4
LABEL OR SOLS (LABEL) ×3 IMPLANT
MESH PARIETEX PROGRIP LEFT (Mesh General) ×3 IMPLANT
NEEDLE HYPO 22GX1.5 SAFETY (NEEDLE) ×6 IMPLANT
NS IRRIG 500ML POUR BTL (IV SOLUTION) ×3 IMPLANT
PACK BASIN MINOR ARMC (MISCELLANEOUS) ×3 IMPLANT
SUT ETHIBOND NAB MO 7 #0 18IN (SUTURE) ×3 IMPLANT
SUT MNCRL 4-0 (SUTURE) ×2
SUT MNCRL 4-0 27XMFL (SUTURE) ×1
SUT SILK 3 0 12 30 (SUTURE) IMPLANT
SUT SILK 3 0 SH 30 (SUTURE) IMPLANT
SUT VIC AB 2-0 CT2 27 (SUTURE) ×3 IMPLANT
SUT VIC AB 3-0 SH 27 (SUTURE) ×2
SUT VIC AB 3-0 SH 27X BRD (SUTURE) ×1 IMPLANT
SUTURE MNCRL 4-0 27XMF (SUTURE) ×1 IMPLANT
SYR 10ML LL (SYRINGE) ×3 IMPLANT
SYR 20CC LL (SYRINGE) ×3 IMPLANT
TOWEL OR 17X26 4PK STRL BLUE (TOWEL DISPOSABLE) ×3 IMPLANT
WATER STERILE IRR 1000ML POUR (IV SOLUTION) ×3 IMPLANT

## 2018-03-11 NOTE — Anesthesia Procedure Notes (Signed)
Procedure Name: LMA Insertion Date/Time: 03/11/2018 10:22 AM Performed by: Lowry Bowl, CRNA Pre-anesthesia Checklist: Emergency Drugs available, Patient identified, Suction available and Patient being monitored Patient Re-evaluated:Patient Re-evaluated prior to induction Oxygen Delivery Method: Circle system utilized Preoxygenation: Pre-oxygenation with 100% oxygen Induction Type: IV induction Ventilation: Mask ventilation without difficulty LMA: LMA inserted LMA Size: 5.0 Number of attempts: 1 Placement Confirmation: breath sounds checked- equal and bilateral and positive ETCO2 Tube secured with: Tape Dental Injury: Teeth and Oropharynx as per pre-operative assessment

## 2018-03-11 NOTE — Op Note (Signed)
Preoperative diagnosis: left recurrent  Inguinal Hernia.  Postoperative diagnosis: left recurrent  Inguinal Hernia  Procedure:  Open recurrent left Inguinal hernia repair with mesh  Anesthesia: General, LMA  Surgeon: Dr. Lysle Pearl  Wound Classification: Clean  Specimen: none  Complications: None  Estimated Blood Loss: 85mL   Indications:  Patient is a 57 y.o. male developed a symptomatic recurrent left inguinal hernia. Repair was indicated to avoid complications of incarceration, obstruction and pain, and a prosthetic mesh repair was elected.  Findings: 1. Vas Deferens and cord structures identified and preserved 2. Progrip mesh used for repair 3. Adequate hemostasis achieved  Description of procedure: The patient was taken to the operating room. A time-out was completed verifying correct patient, procedure, site, positioning, and implant(s) and/or special equipment prior to beginning this procedure. The left groin was prepped and draped in the usual sterile fashion. An incision was marked in a natural skin crease and planned to end near the pubic tubercle.  The skin crease incision was made with a knife and deepened through Scarpa's and Camper's fascia with electrocautery until the aponeurosis of the external oblique was encountered. This was cleaned and the external ring was exposed. Hemostasis was achieved in the wound. An incision was made in the midportion of the external oblique aponeurosis in the direction of its fibers. The ilioinguinal nerve was identified and protected throughout the dissection. Flaps of the external oblique were developed cephalad and inferiorly.  The cord was identified. It was gently dissected free at the pubic tubercle and encircled with a Penrose drain. Attention was directed to the anteromedial aspect of the cord, where an indirect hernia sac was identified. The sac was carefully dissected free of the cord down to the level of the internal ring and sutured  ligated with 3-0 vicryl along with an adjacent cord lipoma. The vas and testicular vessels were identified and protected from harm.   Attention then turned to the floor of the canal, which also had a direct hernia.  This was reduced and the floor was reapproximated with 0 ethibond running under minimal tension. The Progrip mesh was inserted and secured to the pubic tubercle using interrupted 0 ethibond sutures. Care was taken to assure that the mesh was placed flat against the floor and wrapped loosely around the cord structures.  Hemostasis was again checked. The Penrose drain was removed. Exparel infused as an ilioinguinal block.  The external oblique aponeurosis was closed with a running suture of 2-0 Vicryl, taking care not to catch the ilioinguinal nerve in the suture line. Scarpa's fascia was closed with interrupted 3-0 Vicryl.  The deep dermal layer closed with interrupted 3-0 Vicryl.  The skin was closed with a subcuticular stitch of Monocryl 4-0. Dermabond was applied.  The testis was gently pulled down into its anatomic position in the scrotum.  The patient tolerated the procedure well and was taken to the postanesthesia care unit in stable condition. Sponge and instrument count correct at end of procedure.

## 2018-03-11 NOTE — OR Nursing (Signed)
Discharge instructions discussed wi th patient and wife. Both voice understanding. 

## 2018-03-11 NOTE — Interval H&P Note (Signed)
History and Physical Interval Note:  03/11/2018 9:41 AM  Bobby Pace  has presented today for surgery, with the diagnosis of left inguinal hernia  The various methods of treatment have been discussed with the patient and family. After consideration of risks, benefits and other options for treatment, the patient has consented to  * No procedures listed * as a surgical intervention .  The patient's history has been reviewed, patient examined, no change in status, stable for surgery.  I have reviewed the patient's chart and labs.  Questions were answered to the patient's satisfaction.     [name] Lysle Pearl

## 2018-03-11 NOTE — Anesthesia Post-op Follow-up Note (Signed)
Anesthesia QCDR form completed.        

## 2018-03-11 NOTE — Discharge Instructions (Signed)

## 2018-03-11 NOTE — Transfer of Care (Signed)
Immediate Anesthesia Transfer of Care Note  Patient: Bobby Pace  Procedure(s) Performed: HERNIA REPAIR INGUINAL ADULT (Left )  Patient Location: PACU  Anesthesia Type:General  Level of Consciousness: awake, alert  and drowsy  Airway & Oxygen Therapy: Patient Spontanous Breathing and Patient connected to face mask oxygen  Post-op Assessment: Report given to RN and Post -op Vital signs reviewed and stable  Post vital signs: Reviewed and stable  Last Vitals:  Vitals Value Taken Time  BP 128/78 03/11/2018 11:57 AM  Temp 36.6 C 03/11/2018 11:57 AM  Pulse 95 03/11/2018 11:58 AM  Resp 16 03/11/2018 11:58 AM  SpO2 100 % 03/11/2018 11:58 AM  Vitals shown include unvalidated device data.  Last Pain:  Vitals:   03/11/18 0616  TempSrc: Tympanic         Complications: No apparent anesthesia complications

## 2018-03-11 NOTE — Anesthesia Preprocedure Evaluation (Signed)
Anesthesia Evaluation  Patient identified by MRN, date of birth, ID band Patient awake    Reviewed: Allergy & Precautions, NPO status , Patient's Chart, lab work & pertinent test results, reviewed documented beta blocker date and time   Airway Mallampati: III  TM Distance: >3 FB     Dental  (+) Chipped   Pulmonary pneumonia, resolved,           Cardiovascular      Neuro/Psych    GI/Hepatic   Endo/Other    Renal/GU      Musculoskeletal   Abdominal   Peds  Hematology   Anesthesia Other Findings   Reproductive/Obstetrics                             Anesthesia Physical Anesthesia Plan  ASA: III  Anesthesia Plan: General   Post-op Pain Management:    Induction: Intravenous  PONV Risk Score and Plan:   Airway Management Planned: LMA  Additional Equipment:   Intra-op Plan:   Post-operative Plan:   Informed Consent: I have reviewed the patients History and Physical, chart, labs and discussed the procedure including the risks, benefits and alternatives for the proposed anesthesia with the patient or authorized representative who has indicated his/her understanding and acceptance.     Plan Discussed with: CRNA  Anesthesia Plan Comments:         Anesthesia Quick Evaluation

## 2018-03-12 ENCOUNTER — Encounter: Payer: Self-pay | Admitting: Surgery

## 2018-03-12 NOTE — Anesthesia Postprocedure Evaluation (Signed)
Anesthesia Post Note  Patient: Bobby Pace  Procedure(s) Performed: HERNIA REPAIR INGUINAL ADULT (Left )  Anesthesia Type: General     Last Vitals:  Vitals:   03/11/18 1243 03/11/18 1333  BP: (!) 148/83 (!) 143/77  Pulse: 81   Resp: 15   Temp: (!) 36.2 C   SpO2: 98% 99%    Last Pain:  Vitals:   03/12/18 0807  TempSrc:   PainSc: [place]

## 2018-03-12 NOTE — Anesthesia Postprocedure Evaluation (Signed)
Anesthesia Post Note  Patient: Bobby Pace  Procedure(s) Performed: HERNIA REPAIR INGUINAL ADULT (Left )  Patient location during evaluation: PACU Anesthesia Type: General Level of consciousness: awake and alert Pain management: pain level controlled Vital Signs Assessment: post-procedure vital signs reviewed and stable Respiratory status: spontaneous breathing, nonlabored ventilation, respiratory function stable and patient connected to nasal cannula oxygen Cardiovascular status: blood pressure returned to baseline and stable Postop Assessment: no apparent nausea or vomiting Anesthetic complications: no     Last Vitals:  Vitals:   03/11/18 1243 03/11/18 1333  BP: (!) 148/83 (!) 143/77  Pulse: 81   Resp: 15   Temp: (!) 36.2 C   SpO2: 98% 99%    Last Pain:  Vitals:   03/12/18 0807  TempSrc:   PainSc: [place]

## 2019-02-05 IMAGING — CT CT ABD-PELV W/ CM
1 of 3 series · 14 of 32 positions shown, 19 images · IV contrast (APPLIED)
Comparison: None.

CLINICAL DATA: Mid abdominal pain which is moved to the right lower
quadrant beginning yesterday. Nausea and vomiting today.

EXAM:
CT ABDOMEN AND PELVIS WITH CONTRAST
TECHNIQUE: Multidetector CT imaging of the abdomen and pelvis was performed
using the standard protocol following bolus administration of
intravenous contrast.
CONTRAST:  100 ml OMNIPAQUE IOHEXOL 300 MG/ML  SOLN

[Series 2: axial st · axial · 0.81mm/px · z∈[-515,-55]mm · 14 of 104 slices shown, 19 images]
[im 6/104  soft-tissue]
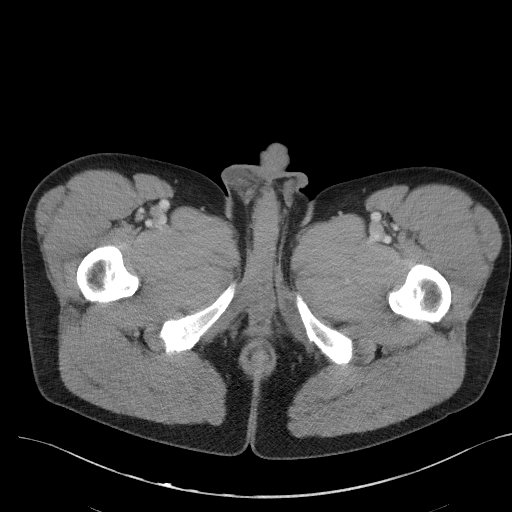
[im 6/104  bone]
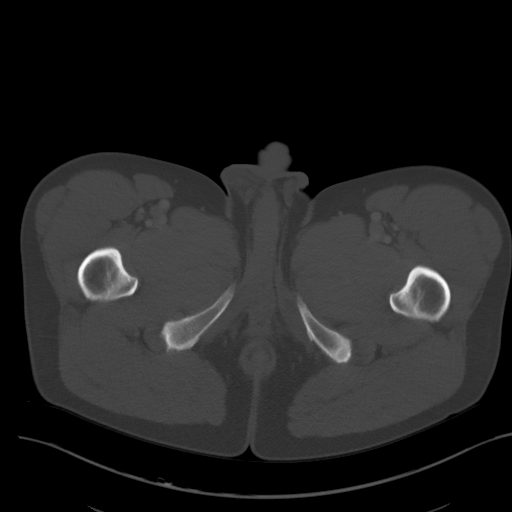
[im 16/104  soft-tissue]
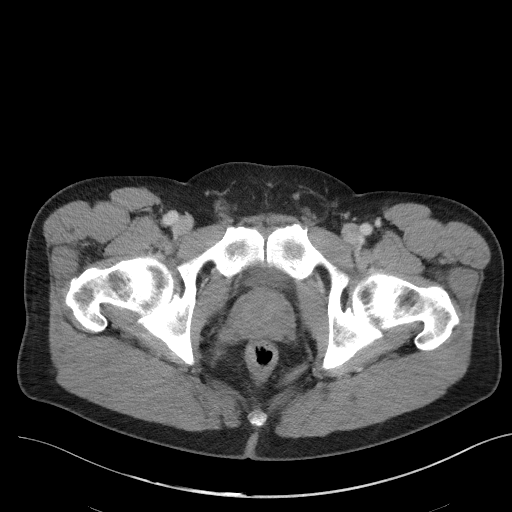
[im 21/104  soft-tissue]
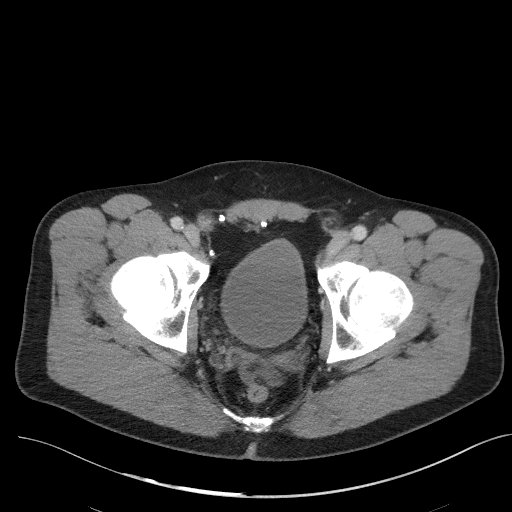
[im 31/104  soft-tissue]
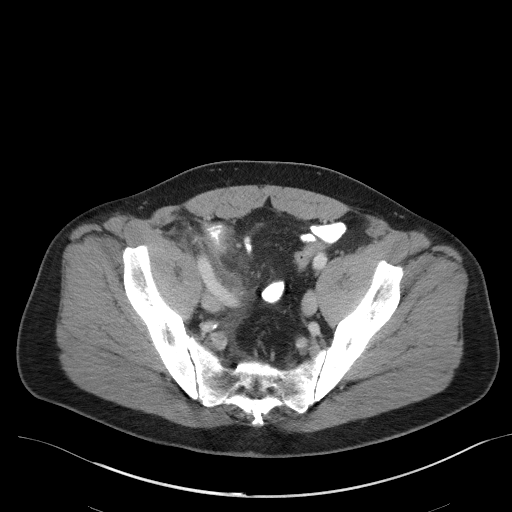
[im 37/104  soft-tissue]
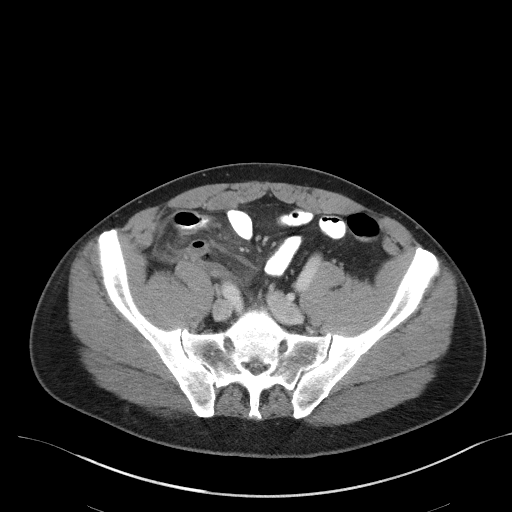
[im 47/104  soft-tissue]
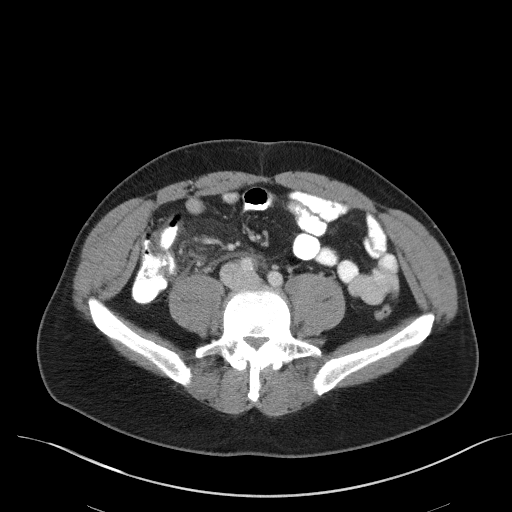
[im 52/104  soft-tissue]
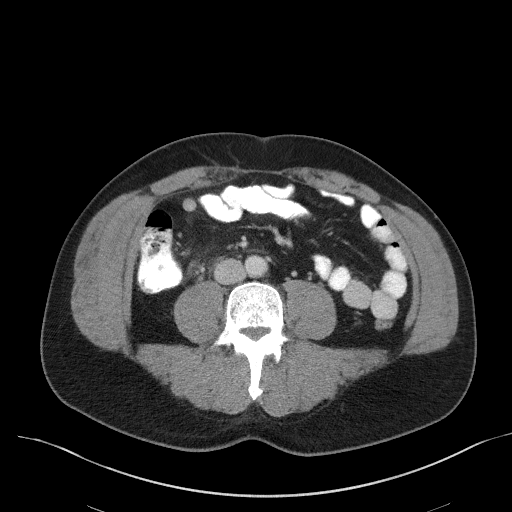
[im 57/104  soft-tissue]
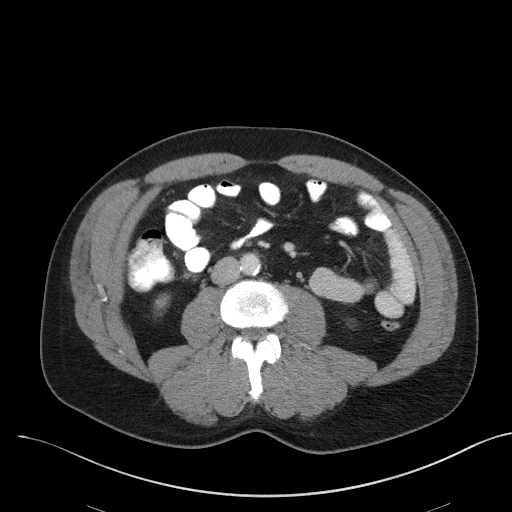
[im 67/104  soft-tissue]
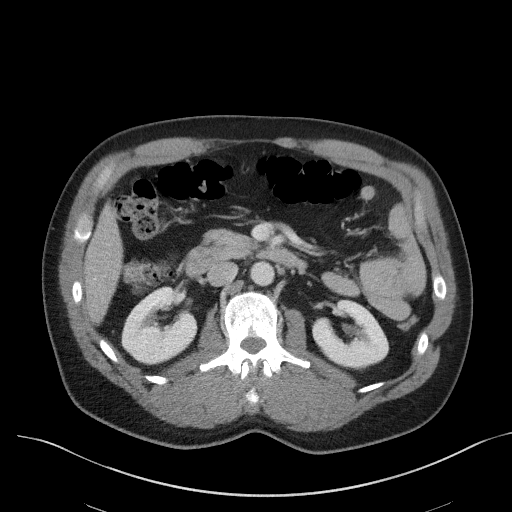
[im 67/104  bone]
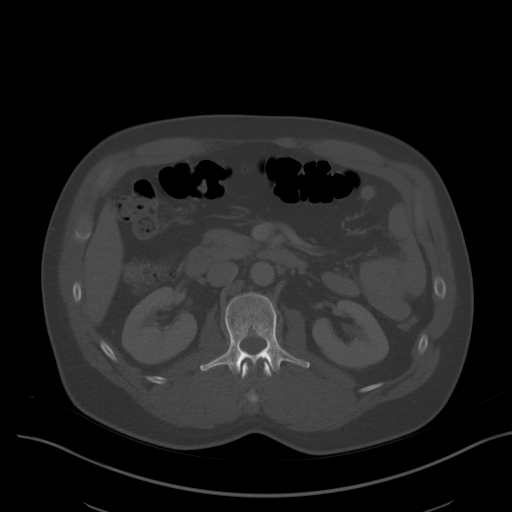
[im 73/104  soft-tissue]
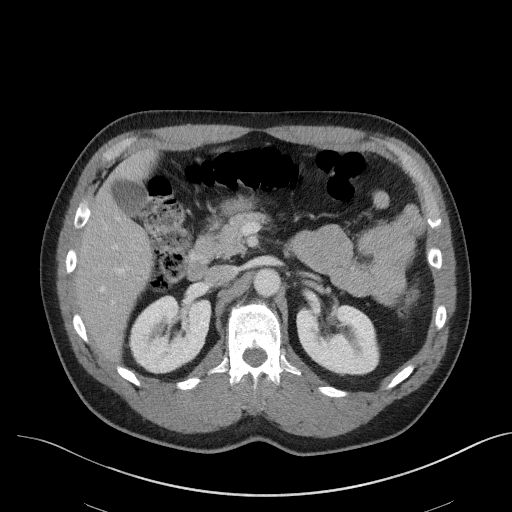
[im 83/104  soft-tissue]
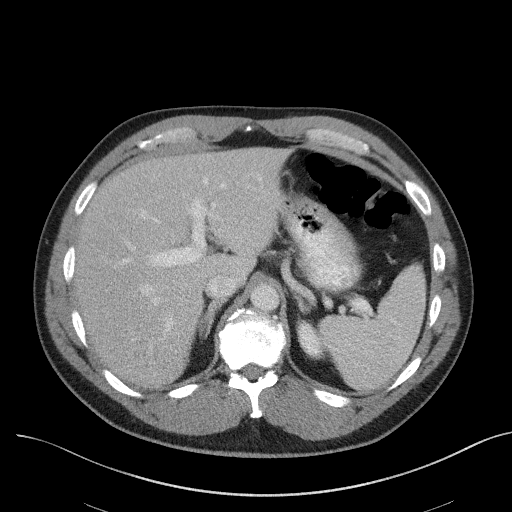
[im 83/104  lung]
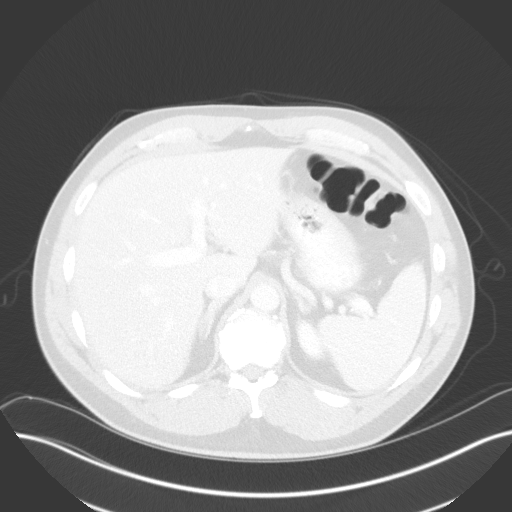
[im 88/104  soft-tissue]
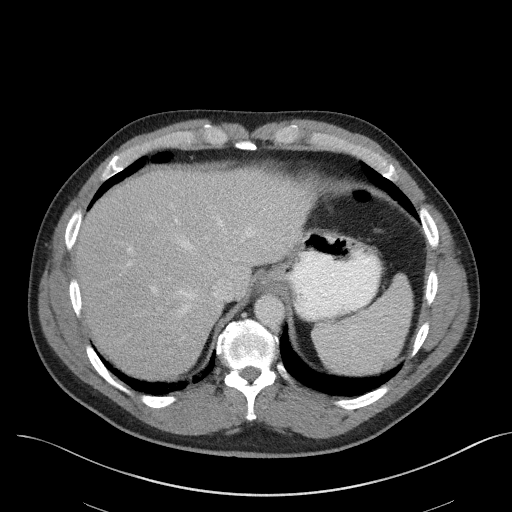
[im 88/104  lung]
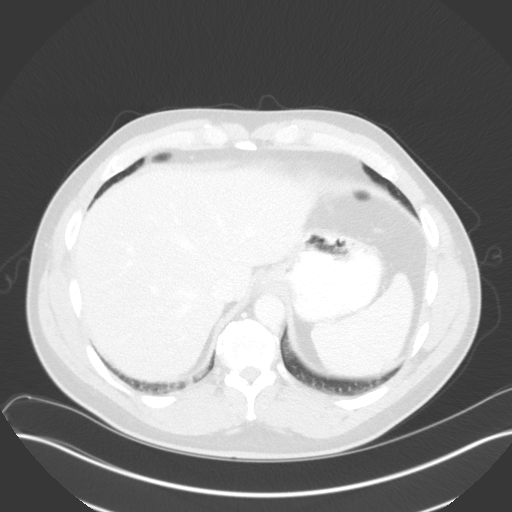
[im 93/104  lung]
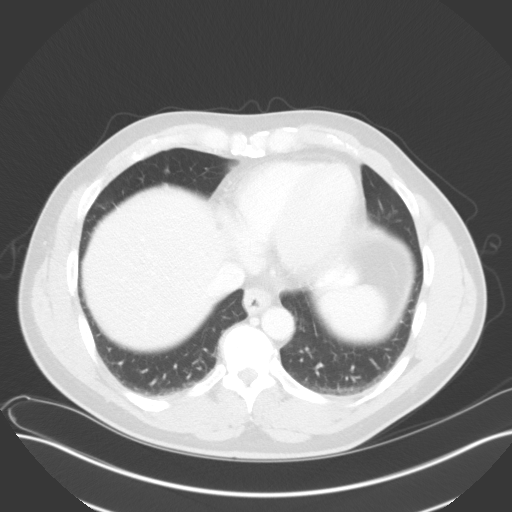
[im 98/104  soft-tissue]
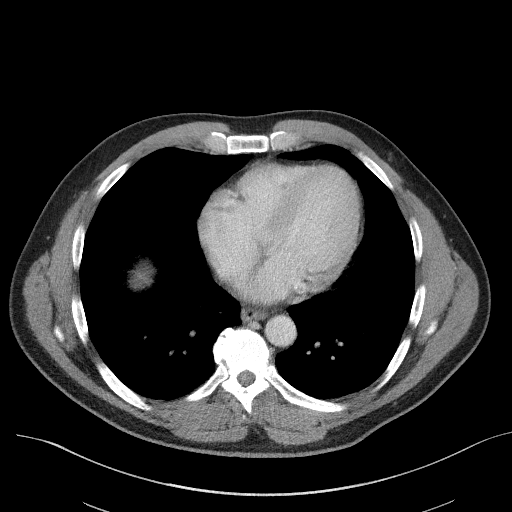
[im 98/104  lung]
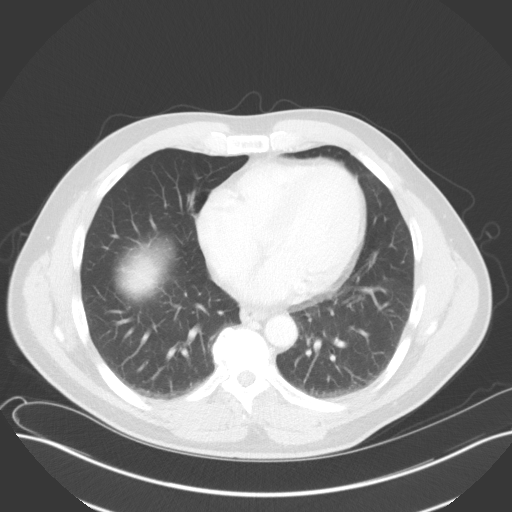

[14 of 32 positions shown; findings below may reference images not displayed]

FINDINGS: Lower chest: Mild dependent atelectasis is seen in the lung bases.
No pleural or pericardial effusion.

Hepatobiliary: No focal liver abnormality is seen. No gallstones,
gallbladder wall thickening, or biliary dilatation.

Pancreas: Unremarkable. No pancreatic ductal dilatation or
surrounding inflammatory changes.

Spleen: Normal in size without focal abnormality.

Adrenals/Urinary Tract: Adrenal glands are unremarkable. Kidneys are
normal, without renal calculi, focal lesion, or hydronephrosis.
Bladder is unremarkable.

Stomach/Bowel: The appendix is dilated with extensive
periappendiceal inflammatory stranding and a small volume of fluid
about the appendix. There is also a small amount of free air
adjacent to the appendix. The stomach and small and large bowel
appear normal.

Vascular/Lymphatic: Aortic atherosclerosis. No enlarged abdominal or
pelvic lymph nodes.

Reproductive: Prostate is unremarkable.

Other: Trace amount of free pelvic fluid noted.

Musculoskeletal: No acute abnormality. Scattered lumbar degenerative
change is most notable at L4-5 and L5-S1.
IMPRESSION: The exam is positive for acute appendicitis. A tiny amount of free
air is seen adjacent to the appendix consistent with perforation. No
other acute abnormality.

Atherosclerosis.

These results were called by telephone at the time of interpretation
on 06/29/2017 at [DATE] to Dr. PUSHPA BOBO , who verbally
acknowledged these results.

## 2019-02-17 ENCOUNTER — Other Ambulatory Visit: Payer: Self-pay

## 2019-02-17 DIAGNOSIS — Z20822 Contact with and (suspected) exposure to covid-19: Secondary | ICD-10-CM

## 2019-02-18 LAB — NOVEL CORONAVIRUS, NAA: SARS-CoV-2, NAA: NOT DETECTED

## 2021-07-02 ENCOUNTER — Ambulatory Visit
Admission: EM | Admit: 2021-07-02 | Discharge: 2021-07-02 | Disposition: A | Discharge status: Home / Self Care | Payer: BC Managed Care – PPO

## 2021-07-02 DIAGNOSIS — J069 Acute upper respiratory infection, unspecified: Secondary | ICD-10-CM

## 2021-07-02 HISTORY — DX: Essential (primary) hypertension: I10

## 2021-07-02 MED ORDER — GUAIFENESIN ER 600 MG PO TB12: 600.0000 mg | ORAL_TABLET | Freq: Two times a day (BID) | ORAL | 0 refills | Status: AC | PRN

## 2021-07-02 MED ORDER — MONTELUKAST SODIUM 10 MG PO TABS: 10.0000 mg | ORAL_TABLET | Freq: Every day | ORAL | 0 refills | Status: AC

## 2021-07-02 MED ORDER — FLUTICASONE PROPIONATE 50 MCG/ACT NA SUSP: 1.0000 | Freq: Every day | NASAL | 0 refills | Status: AC

## 2021-07-02 MED ORDER — BENZONATATE 100 MG PO CAPS: 100.0000 mg | ORAL_CAPSULE | Freq: Three times a day (TID) | ORAL | 0 refills | Status: AC

## 2021-07-02 NOTE — Discharge Instructions (Addendum)
Your symptoms today are likely due to rhinovirus. ?Purchase a warm mist vaporizer for use at nighttime, or use steam from a hot shower. ?Plain guaifenesin or Mucinex may be beneficial to help loosen the phlegm. ?Please start using the nasal spray prescribed today, 1-2 times daily per nostril.  Stop use if nasal bleeding occurs ?Start taking the montelukast nightly, this should help dry up the phlegm. ?Use the cough suppressant only as needed. ?

## 2021-07-02 NOTE — ED Provider Notes (Signed)
?[facility] ? ? ? ?CSN: [id number] ?Arrival date & time: 07/02/21  0947 ? ? ?  ? ?History   ?Chief Complaint ?Chief Complaint  ?Patient presents with  ? Headache  ? Cough  ? ? ?HPI ?Bobby Pace is a 61 y.o. male.  ? ?Pleasant 61 year old male presents today with a 5-day history of upper respiratory symptoms.  He states he been having nasal congestion and rhinorrhea since Thursday of last week.  He notes that this morning he has been having a cough productive of loose phlegm, and states he was coughing so hard it hurt his right rib. He reports "headache" primarily to his nasal bridge area. He denies any swelling or bruising.  He denies any chronic respiratory illness.  He does not smoke.  He has not had a fever.  He has been taking over-the-counter DayQuil and NyQuil, last dose this morning.  He states he did try Advil allergy the first day, but felt it was ineffective.  He denies any sinus pain, sinus pressure, radiation to teeth, neck stiffness, lymphadenopathy.  He denies any chest pain, chest pressure, shortness of breath or wheezing.  He states he did "hear it rattling this morning". ? ? ?Headache ?Associated symptoms: congestion, cough and drainage   ?Cough ?Associated symptoms: headaches and rhinorrhea   ? ?Past Medical History:  ?Diagnosis Date  ? Cancer [facility])   ? Skin  ? Heart murmur   ? Hypertension   ? Pneumonia   ? ? ?Patient Active Problem List  ? Diagnosis Date Noted  ? Acute appendicitis with rupture 06/29/2017  ? Pure hypercholesterolemia 01/29/2015  ? Popliteal pain 11/04/2011  ? Hip pain, right 10/15/2011  ? Nonallopathic lesion of lumbosacral region 10/15/2011  ? HIP PAIN, RIGHT 04/11/2010  ? UNEQUAL LEG LENGTH 07/19/2009  ? ANKLE PAIN, RIGHT 06/21/2009  ? TENDINITIS TIBIALIS 06/21/2009  ? 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 ABNORMALITY OF GAIT 06/21/2009  ? ? ?Past Surgical History:  ?Procedure Laterality Date  ? HERNIA REPAIR Right 2007  ? Laparoscopic repair of  Right inguinal hernia with mesh  ? INGUINAL HERNIA REPAIR Left 03/11/2018  ? Procedure: HERNIA REPAIR INGUINAL ADULT;  Surgeon: Benjamine Sprague, DO;  Location: [facility] ORS;  Service: General;  Laterality: Left;  ? LAPAROSCOPIC APPENDECTOMY N/A 06/29/2017  ? Procedure: APPENDECTOMY LAPAROSCOPIC;  Surgeon: Olean Ree, MD;  Location: [facility] ORS;  Service: General;  Laterality: N/A;  ? ? ? ? ? ?Home Medications   ? ?Prior to Admission medications   ?Medication Sig Start Date End Date Taking? Authorizing Provider  ?benzonatate (TESSALON) 100 MG capsule Take 1 capsule (100 mg total) by mouth every 8 (eight) hours. 07/02/21  Yes Kiano Terrien L, PA  ?fluticasone (FLONASE) 50 MCG/ACT nasal spray Place 1 spray into both nostrils daily. 07/02/21  Yes Shanti Eichel L, PA  ?guaiFENesin (MUCINEX) 600 MG 12 hr tablet Take 1 tablet (600 mg total) by mouth 2 (two) times daily as needed for cough or to loosen phlegm. 07/02/21  Yes Aydeen Blume L, PA  ?hydrochlorothiazide (HYDRODIURIL) 25 MG tablet Take 1 tablet by mouth daily. 02/14/21 02/14/22 Yes [provider]  ?montelukast (SINGULAIR) 10 MG tablet Take 1 tablet (10 mg total) by mouth at bedtime. 07/02/21  Yes Bow Buntyn L, PA  ?aspirin EC 81 MG tablet Take 81 mg by mouth daily.    [provider]  ?calcium carbonate (TUMS - DOSED IN MG ELEMENTAL CALCIUM) 500 MG chewable tablet Chew 1-2 tablets by  mouth daily as needed for indigestion or heartburn.    [provider]  ?ibuprofen (ADVIL,MOTRIN) 800 MG tablet Take 1 tablet (800 mg total) by mouth every 8 (eight) hours as needed for mild pain or moderate pain. 03/11/18   Benjamine Sprague, DO  ?Omega-3 Fatty Acids (FISH OIL) 1000 MG CAPS Take 1,000 mg by mouth 2 (two) times daily.    [provider]  ?Red Yeast Rice 600 MG CAPS Take 1,200 mg by mouth 2 (two) times daily.    [provider]  ? ? ?Family History ?Family History  ?Problem Relation Age of Onset  ? Healthy Mother   ? Congestive Heart  Failure Father   ? Kidney disease Father   ? ? ?Social History ?Social History  ? ?Tobacco Use  ? Smoking status: Never  ? Smokeless tobacco: Never  ?Vaping Use  ? Vaping Use: Never used  ?Substance Use Topics  ? Alcohol use: Yes  ?  Comment: rarely  ? Drug use: No  ? ? ? ?Allergies   ?Patient has no known allergies. ? ? ?Review of Systems ?Review of Systems  ?HENT:  Positive for congestion, postnasal drip and rhinorrhea.   ?Respiratory:  Positive for cough.   ?Neurological:  Positive for headaches.  ?All other systems reviewed and are negative. ? ? ?Physical Exam ?Triage Vital Signs ?ED Triage Vitals  ?Enc Vitals Group  ?   BP 07/02/21 1135 (!) 149/100  ?   Pulse Rate 07/02/21 1135 69  ?   Resp 07/02/21 1135 16  ?   Temp 07/02/21 1135 98.8 ?F (37.1 ?C)  ?   Temp Source 07/02/21 1135 Oral  ?   SpO2 07/02/21 1135 98 %  ?   Weight --   ?   Height --   ?   Head Circumference --   ?   Peak Flow --   ?   Pain Score 07/02/21 1134 0  ?   Pain Loc --   ?   Pain Edu? --   ?   Excl. in [place]? --   ? ?No data found. ? ?Updated Vital Signs ?BP (!) 149/100 (BP Location: Left Arm)   Pulse 69   Temp 98.8 ?F (37.1 ?C) (Oral)   Resp 16   SpO2 98%  ? ?Visual Acuity ?Right Eye Distance:   ?Left Eye Distance:   ?Bilateral Distance:   ? ?Right Eye Near:   ?Left Eye Near:    ?Bilateral Near:    ? ?Physical Exam ?Vitals and nursing note reviewed.  ?Constitutional:   ?   General: He is not in acute distress. ?   Appearance: He is well-developed and normal weight. He is not ill-appearing, toxic-appearing or diaphoretic.  ?HENT:  ?   Head: Normocephalic and atraumatic.  ?   Salivary Glands: Right salivary gland is not diffusely enlarged or tender. Left salivary gland is not diffusely enlarged or tender.  ?   Right Ear: Tympanic membrane, ear canal and external ear normal. No decreased hearing noted. No middle ear effusion. There is no impacted cerumen. Tympanic membrane is not injected, scarred, perforated, erythematous, retracted or  bulging. Tympanic membrane has normal mobility.  ?   Left Ear: Tympanic membrane, ear canal and external ear normal. No decreased hearing noted.  No middle ear effusion. There is no impacted cerumen. Tympanic membrane is not injected, scarred, perforated, erythematous, retracted or bulging. Tympanic membrane has normal mobility.  ?   Nose: Congestion and rhinorrhea present. No  nasal deformity or nasal tenderness. Rhinorrhea is clear.  ?   Right Nostril: No foreign body.  ?   Left Nostril: No foreign body.  ?   Right Turbinates: Swollen.  ?   Left Turbinates: Swollen.  ?   Right Sinus: No maxillary sinus tenderness or frontal sinus tenderness.  ?   Left Sinus: No maxillary sinus tenderness or frontal sinus tenderness.  ?   Mouth/Throat:  ?   Lips: Pink.  ?   Mouth: Mucous membranes are moist. No oral lesions.  ?   Tongue: No lesions.  ?   Pharynx: Oropharynx is clear. No pharyngeal swelling, oropharyngeal exudate, posterior oropharyngeal erythema or uvula swelling.  ?   Comments: Cobblestoning/ lymphoid hyperplasia to posterior pharynx without erythema ?Eyes:  ?   General: No scleral icterus. ?   Extraocular Movements: Extraocular movements intact.  ?   Right eye: Normal extraocular motion and no nystagmus.  ?   Left eye: Normal extraocular motion and no nystagmus.  ?   Conjunctiva/sclera: Conjunctivae normal.  ?   Pupils: Pupils are equal, round, and reactive to light. Pupils are equal.  ?   Right eye: Pupil is round and reactive.  ?   Left eye: Pupil is round and reactive.  ?Cardiovascular:  ?   Rate and Rhythm: Normal rate and regular rhythm.  ?   Heart sounds: No murmur heard. ?Pulmonary:  ?   Effort: Pulmonary effort is normal. No respiratory distress.  ?   Breath sounds: Normal breath sounds.  ?Abdominal:  ?   Palpations: Abdomen is soft.  ?   Tenderness: There is no abdominal tenderness.  ?Musculoskeletal:     ?   General: No swelling.  ?   Cervical back: Normal range of motion and neck supple. No rigidity.   ?Lymphadenopathy:  ?   Cervical: No cervical adenopathy.  ?Skin: ?   General: Skin is warm and dry.  ?   Capillary Refill: Capillary refill takes less than 2 seconds.  ?Neurological:  ?   Mental Status: He is a

## 2021-07-02 NOTE — ED Triage Notes (Signed)
Patient presents to Urgent Care with complaints of cough, nasal congestion since last Thursday. He states symptoms not improving with advil cold/sinus or nyquil.  ? ?Denies fever.  ?

## 2021-11-20 ENCOUNTER — Other Ambulatory Visit: Payer: Self-pay | Admitting: Family Medicine

## 2021-11-20 DIAGNOSIS — Z0000 Encounter for general adult medical examination without abnormal findings: Secondary | ICD-10-CM

## 2021-11-20 DIAGNOSIS — Z8249 Family history of ischemic heart disease and other diseases of the circulatory system: Secondary | ICD-10-CM

## 2021-11-27 ENCOUNTER — Ambulatory Visit
Admission: RE | Admit: 2021-11-27 | Discharge: 2021-11-27 | Disposition: A | Discharge status: Home / Self Care | Payer: No Typology Code available for payment source | Source: Ambulatory Visit | Attending: Family Medicine | Admitting: Family Medicine

## 2021-11-27 DIAGNOSIS — Z8249 Family history of ischemic heart disease and other diseases of the circulatory system: Secondary | ICD-10-CM

## 2021-11-27 DIAGNOSIS — Z0000 Encounter for general adult medical examination without abnormal findings: Secondary | ICD-10-CM

## 2024-01-06 ENCOUNTER — Encounter: Payer: Self-pay | Admitting: Gastroenterology

## 2024-01-07 ENCOUNTER — Ambulatory Visit: Admitting: General Practice

## 2024-01-07 ENCOUNTER — Ambulatory Visit
Admission: RE | Admit: 2024-01-07 | Discharge: 2024-01-07 | Disposition: A | Discharge status: Home / Self Care | Payer: Self-pay | Attending: Gastroenterology | Admitting: Gastroenterology

## 2024-01-07 ENCOUNTER — Other Ambulatory Visit: Payer: Self-pay

## 2024-01-07 ENCOUNTER — Encounter: Admission: RE | Disposition: A | Payer: Self-pay | Source: Home / Self Care | Attending: Gastroenterology

## 2024-01-07 ENCOUNTER — Encounter: Payer: Self-pay | Admitting: Gastroenterology

## 2024-01-07 DIAGNOSIS — Z1211 Encounter for screening for malignant neoplasm of colon: Secondary | ICD-10-CM | POA: Insufficient documentation

## 2024-01-07 DIAGNOSIS — Z79899 Other long term (current) drug therapy: Secondary | ICD-10-CM | POA: Diagnosis not present

## 2024-01-07 DIAGNOSIS — D123 Benign neoplasm of transverse colon: Secondary | ICD-10-CM | POA: Insufficient documentation

## 2024-01-07 DIAGNOSIS — Z7982 Long term (current) use of aspirin: Secondary | ICD-10-CM | POA: Diagnosis not present

## 2024-01-07 DIAGNOSIS — I10 Essential (primary) hypertension: Secondary | ICD-10-CM | POA: Diagnosis not present

## 2024-01-07 DIAGNOSIS — D122 Benign neoplasm of ascending colon: Secondary | ICD-10-CM | POA: Diagnosis not present

## 2024-01-07 DIAGNOSIS — Z83719 Family history of colon polyps, unspecified: Secondary | ICD-10-CM | POA: Insufficient documentation

## 2024-01-07 HISTORY — DX: Prediabetes: R73.03

## 2024-01-07 HISTORY — PX: COLONOSCOPY: SHX5424

## 2024-01-07 HISTORY — PX: POLYPECTOMY: SHX149

## 2024-01-07 SURGERY — COLONOSCOPY
Anesthesia: General

## 2024-01-07 MED ORDER — PROPOFOL 500 MG/50ML IV EMUL
INTRAVENOUS | Status: DC | PRN
  Administered 2024-01-07: 120 ug/kg/min via INTRAVENOUS

## 2024-01-07 MED ORDER — LIDOCAINE HCL (PF) 2 % IJ SOLN
INTRAMUSCULAR | Status: AC
  Filled 2024-01-07: qty 5

## 2024-01-07 MED ORDER — PROPOFOL 10 MG/ML IV BOLUS
INTRAVENOUS | Status: DC | PRN
  Administered 2024-01-07: 30 mg via INTRAVENOUS
  Administered 2024-01-07: 70 mg via INTRAVENOUS

## 2024-01-07 MED ORDER — LIDOCAINE 2% (20 MG/ML) 5 ML SYRINGE
INTRAMUSCULAR | Status: DC | PRN
  Administered 2024-01-07: 20 mg via INTRAVENOUS

## 2024-01-07 MED ORDER — PROPOFOL 1000 MG/100ML IV EMUL
INTRAVENOUS | Status: AC
  Filled 2024-01-07: qty 100

## 2024-01-07 MED ORDER — SODIUM CHLORIDE 0.9 % IV SOLN: INTRAVENOUS | Status: DC

## 2024-01-07 NOTE — Transfer of Care (Signed)
 Immediate Anesthesia Transfer of Care Note  Patient: Bobby Pace  Procedure(s) Performed: COLONOSCOPY POLYPECTOMY, INTESTINE  Patient Location: Endoscopy Unit  Anesthesia Type:General  Level of Consciousness: awake, alert , and oriented  Airway & Oxygen Therapy: Patient Spontanous Breathing  Post-op Assessment: Report given to RN and Post -op Vital signs reviewed and stable  Post vital signs: Reviewed  Last Vitals:  Vitals Value Taken Time  BP 114/78   Temp    Pulse 74 01/07/24 08:07  Resp 22 01/07/24 08:07  SpO2 98 % 01/07/24 08:07  Vitals shown include unfiled device data.  Last Pain:  Vitals:   01/07/24 0716  TempSrc: Temporal  PainSc: 0-No pain         Complications: No notable events documented.

## 2024-01-07 NOTE — Op Note (Signed)
 [facility] Gastroenterology Patient Name: Bobby Pace Procedure Date: 01/07/2024 7:20 AM MRN: [id number] Account #: [id number] Date of Birth: 17-Aug-1960 Admit Type: Outpatient Age: 63 Room: [facility] ENDO ROOM 2 Gender: Male Note Status: Finalized Instrument Name: Colon Scope [id number] Procedure:             Colonoscopy Indications:           Screening for colorectal malignant neoplasm, Colon                         cancer screening in patient at increased risk: Family                         history of 1st-degree relative with colon polyps Providers:             Ruel Kung MD, MD Referring MD:          Alda Carpen (Referring MD) Medicines:             Monitored Anesthesia Care Complications:         No immediate complications. Procedure:             Pre-Anesthesia Assessment:                        - Prior to the procedure, a History and Physical was                         performed, and patient medications, allergies and                         sensitivities were reviewed. The patient's tolerance                         of previous anesthesia was reviewed.                        - The risks and benefits of the procedure and the                         sedation options and risks were discussed with the                         patient. All questions were answered and informed                         consent was obtained.                        - ASA Grade Assessment: II - A patient with mild                         systemic disease.                        After obtaining informed consent, the colonoscope was                         passed under direct vision. Throughout the procedure,  the patient's blood pressure, pulse, and oxygen                         saturations were monitored continuously. The                         Colonoscope was introduced through the anus and                         advanced to the the cecum, identified by  the                         appendiceal orifice. The colonoscopy was performed                         with ease. The patient tolerated the procedure well.                         The quality of the bowel preparation was excellent.                         The ileocecal valve, appendiceal orifice, and rectum                         were photographed. Findings:      The perianal and digital rectal examinations were normal.      Two sessile polyps were found in the transverse colon and ascending       colon. The polyps were 3 to 4 mm in size. These polyps were removed with       a jumbo cold forceps. Resection and retrieval were complete.      The exam was otherwise without abnormality on direct and retroflexion       views. Impression:            - Two 3 to 4 mm polyps in the transverse colon and in                         the ascending colon, removed with a jumbo cold                         forceps. Resected and retrieved.                        - The examination was otherwise normal on direct and                         retroflexion views. Recommendation:        - Discharge patient to home (with escort).                        - Resume previous diet.                        - Continue present medications.                        - Await pathology results.                        -  Repeat colonoscopy for surveillance based on                         pathology results. Procedure Code(s):     --- Professional ---                        [phone], Colonoscopy, flexible; with biopsy, single or                         multiple Diagnosis Code(s):     --- Professional ---                        Z12.11, Encounter for screening for malignant neoplasm                         of colon                        Z83.71, Family history of colonic polyps                        D12.3, Benign neoplasm of transverse colon (hepatic                         flexure or splenic flexure)                        D12.2,  Benign neoplasm of ascending colon CPT copyright 2022 American Medical Association. All rights reserved. The codes documented in this report are preliminary and upon coder review may  be revised to meet current compliance requirements. Ruel Kung, MD Ruel Kung MD, MD 01/07/2024 8:04:47 AM This report has been signed electronically. Number of Addenda: 0 Note Initiated On: 01/07/2024 7:20 AM Scope Withdrawal Time: 0 hours 10 minutes 8 seconds  Total Procedure Duration: 0 hours 11 minutes 52 seconds  Estimated Blood Loss:  Estimated blood loss: none.      [facility]

## 2024-01-07 NOTE — H&P (Signed)
 Ruel Kung , MD [address], [place], [place], [address] Phone: [phone] Fax: [phone]  Primary Care Physician:  Alla Amis, MD   Pre-Procedure History & Physical: HPI:  Bobby Pace is a 63 y.o. male is here for an colonoscopy.   Past Medical History:  Diagnosis Date   Cancer (HCC)    Skin   Heart murmur    Hypertension    Pneumonia    Pre-diabetes    diet control    Past Surgical History:  Procedure Laterality Date   APPENDECTOMY     COLONOSCOPY     HERNIA REPAIR Right 2007   Laparoscopic repair of Right inguinal hernia with mesh   INGUINAL HERNIA REPAIR Left 03/11/2018   Procedure: HERNIA REPAIR INGUINAL ADULT;  Surgeon: Tye Millet, DO;  Location: [facility] ORS;  Service: General;  Laterality: Left;   LAPAROSCOPIC APPENDECTOMY N/A 06/29/2017   Procedure: APPENDECTOMY LAPAROSCOPIC;  Surgeon: Desiderio Schanz, MD;  Location: [facility] ORS;  Service: General;  Laterality: N/A;   MOHS SURGERY     SKIN CANCER EXCISION      Prior to Admission medications   Medication Sig Start Date End Date Taking? Authorizing Provider  hydrochlorothiazide (HYDRODIURIL) 25 MG tablet Take 1 tablet by mouth daily. 02/14/21  Yes [provider]  losartan (COZAAR) 25 MG tablet Take 25 mg by mouth daily.   Yes [provider]  Omega-3 Fatty Acids (FISH OIL) 1000 MG CAPS Take 1,000 mg by mouth 2 (two) times daily.   Yes [provider]  Red Yeast Rice 600 MG CAPS Take 1,200 mg by mouth 2 (two) times daily.   Yes [provider]  aspirin EC 81 MG tablet Take 81 mg by mouth daily.    [provider]  benzonatate  (TESSALON ) 100 MG capsule Take 1 capsule (100 mg total) by mouth every 8 (eight) hours. Patient not taking: Reported on 01/07/2024 07/02/21   Crain, Whitney L, PA  calcium carbonate (TUMS - DOSED  IN MG ELEMENTAL CALCIUM) 500 MG chewable tablet Chew 1-2 tablets by mouth daily as needed for indigestion or heartburn. Patient not taking: Reported on 01/07/2024    [provider]  fluticasone  (FLONASE ) 50 MCG/ACT nasal spray Place 1 spray into both nostrils daily. 07/02/21   Crain, Whitney L, PA  guaiFENesin  (MUCINEX ) 600 MG 12 hr tablet Take 1 tablet (600 mg total) by mouth 2 (two) times daily as needed for cough or to loosen phlegm. 07/02/21   Crain, Whitney L, PA  ibuprofen  (ADVIL ,MOTRIN ) 800 MG tablet Take 1 tablet (800 mg total) by mouth every 8 (eight) hours as needed for mild pain or moderate pain. 03/11/18   Sakai, Isami, DO  montelukast  (SINGULAIR ) 10 MG tablet Take 1 tablet (10 mg total) by mouth at bedtime. Patient not taking: Reported on 01/07/2024 07/02/21   Crain, Whitney L, PA    Allergies as of 12/12/2023   (No Known Allergies)    Family History  Problem Relation Age of Onset   Healthy Mother    Congestive Heart Failure Father    Kidney disease Father     Social History   Socioeconomic History   Marital status: Married    Spouse name: Not on file   Number of children: Not on file   Years of education: Not on file   Highest education level: Not on file  Occupational History   Not on file  Tobacco Use   Smoking status: Never   Smokeless  tobacco: Never  Vaping Use   Vaping status: Never Used  Substance and Sexual Activity   Alcohol use: Yes    Comment: rarely,none last 24hrs   Drug use: No   Sexual activity: Yes  Other Topics Concern   Not on file  Social History Narrative   Not on file   Social Drivers of Health   Financial Resource Strain: Low Risk  (02/25/2023)   Received from [facility]   Overall Financial Resource Strain (CARDIA)    Difficulty of Paying Living Expenses: Not hard at all  Food Insecurity: No Food Insecurity (02/25/2023)   Received from [facility]   Hunger Vital Sign    Within the past  12 months, you worried that your food would run out before you got the money to buy more.: Never true    Within the past 12 months, the food you bought just didn't last and you didn't have money to get more.: Never true  Transportation Needs: No Transportation Needs (02/25/2023)   Received from [facility] - Transportation    In the past 12 months, has lack of transportation kept you from medical appointments or from getting medications?: No    Lack of Transportation (Non-Medical): No  Physical Activity: Sufficiently Active (06/30/2017)   Exercise Vital Sign    Days of Exercise per Week: 6 days    Minutes of Exercise per Session: 60 min  Stress: No Stress Concern Present (06/30/2017)   [employer] of Occupational Health - Occupational Stress Questionnaire    Feeling of Stress : Not at all  Social Connections: Socially Integrated (06/30/2017)   Social Connection and Isolation Panel    Frequency of Communication with Friends and Family: More than three times a week    Frequency of Social Gatherings with Friends and Family: More than three times a week    Attends Religious Services: More than 4 times per year    Active Member of [employer] or Organizations: Yes    Attends Banker Meetings: 1 to 4 times per year    Marital Status: Married  Catering manager Violence: Not At Risk (06/30/2017)   Humiliation, Afraid, Rape, and Kick questionnaire    Fear of Current or Ex-Partner: No    Emotionally Abused: No    Physically Abused: No    Sexually Abused: No    Review of Systems: See HPI, otherwise negative ROS  Physical Exam: BP (!) 154/94   Pulse 70   Temp (!) 97 F (36.1 C) (Temporal)   Resp 20   Ht 6' (1.829 m)   Wt 101.6 kg   SpO2 99%   BMI 30.38 kg/m  General:   Alert,  pleasant and cooperative in NAD Head:  Normocephalic and atraumatic. Neck:  Supple; no masses or thyromegaly. Lungs:  Clear throughout to auscultation, normal respiratory  effort.    Heart:  +S1, +S2, Regular rate and rhythm, No edema. Abdomen:  Soft, nontender and nondistended. Normal bowel sounds, without guarding, and without rebound.   Neurologic:  Alert and  oriented x4;  grossly normal neurologically.  Impression/Plan: Bobby Pace is here for an colonoscopy to be performed for Screening colonoscopy , F/h of colon polyps Risks, benefits, limitations, and alternatives regarding  colonoscopy have been reviewed with the patient.  Questions have been answered.  All parties agreeable.   Ruel Kung, MD  01/07/2024, 7:45 AM

## 2024-01-07 NOTE — Anesthesia Postprocedure Evaluation (Signed)
 Anesthesia Post Note  Patient: Bobby Pace  Procedure(s) Performed: COLONOSCOPY POLYPECTOMY, INTESTINE  Patient location during evaluation: Endoscopy Anesthesia Type: General Level of consciousness: awake and alert Pain management: pain level controlled Vital Signs Assessment: post-procedure vital signs reviewed and stable Respiratory status: spontaneous breathing, nonlabored ventilation, respiratory function stable and patient connected to nasal cannula oxygen Cardiovascular status: blood pressure returned to baseline and stable Postop Assessment: no apparent nausea or vomiting Anesthetic complications: no   No notable events documented.   Last Vitals:  Vitals:   01/07/24 0807 01/07/24 0817  BP: 114/82 (!) 132/55  Pulse: 74 67  Resp: (!) 22 15  Temp: (!) 36.1 C   SpO2: 98% 97%    Last Pain:  Vitals:   01/07/24 0817  TempSrc:   PainSc: 0-No pain                 [place]

## 2024-01-07 NOTE — Anesthesia Preprocedure Evaluation (Signed)
 Anesthesia Evaluation  Patient identified by MRN, date of birth, ID band Patient awake    Reviewed: Allergy & Precautions, NPO status , Patient's Chart, lab work & pertinent test results, reviewed documented beta blocker date and time   Airway Mallampati: III  TM Distance: >3 FB Neck ROM: full    Dental  (+) Chipped   Pulmonary neg pulmonary ROS, pneumonia, resolved   Pulmonary exam normal        Cardiovascular hypertension, Normal cardiovascular exam     Neuro/Psych negative neurological ROS  negative psych ROS   GI/Hepatic negative GI ROS, Neg liver ROS,,,  Endo/Other  negative endocrine ROS    Renal/GU negative Renal ROS  negative genitourinary   Musculoskeletal   Abdominal   Peds  Hematology negative hematology ROS (+)   Anesthesia Other Findings Past Medical History: No date: Cancer (HCC)     Comment:  Skin No date: Heart murmur No date: Hypertension No date: Pneumonia No date: Pre-diabetes     Comment:  diet control  Past Surgical History: No date: APPENDECTOMY No date: COLONOSCOPY 2007: HERNIA REPAIR; Right     Comment:  Laparoscopic repair of Right inguinal hernia with mesh 03/11/2018: INGUINAL HERNIA REPAIR; Left     Comment:  Procedure: HERNIA REPAIR INGUINAL ADULT;  Surgeon:               Tye Millet, DO;  Location: [facility] ORS;  Service: General;              Laterality: Left; 06/29/2017: LAPAROSCOPIC APPENDECTOMY; N/A     Comment:  Procedure: APPENDECTOMY LAPAROSCOPIC;  Surgeon: Desiderio Schanz, MD;  Location: [facility]S;  Service: General;                Laterality: N/A; No date: MOHS SURGERY No date: SKIN CANCER EXCISION  BMI    Body Mass Index: 30.38 kg/m      Reproductive/Obstetrics negative OB ROS                              Anesthesia Physical Anesthesia Plan  ASA: 2  Anesthesia Plan: General   Post-op Pain Management: Minimal or no pain  anticipated   Induction: Intravenous  PONV Risk Score and Plan: 2 and Propofol  infusion and TIVA  Airway Management Planned: Nasal Cannula  Additional Equipment: None  Intra-op Plan:   Post-operative Plan:   Informed Consent: I have reviewed the patients History and Physical, chart, labs and discussed the procedure including the risks, benefits and alternatives for the proposed anesthesia with the patient or authorized representative who has indicated his/her understanding and acceptance.     Dental advisory given  Plan Discussed with: CRNA and Surgeon  Anesthesia Plan Comments: (Discussed risks of anesthesia with patient, including possibility of difficulty with spontaneous ventilation under anesthesia necessitating airway intervention, PONV, and rare risks such as cardiac or respiratory or neurological events, and allergic reactions. Discussed the role of CRNA in patient's perioperative care. Patient understands.)        Anesthesia Quick Evaluation

## 2024-01-08 LAB — SURGICAL PATHOLOGY

## 2024-03-02 ENCOUNTER — Ambulatory Visit
Admission: EM | Admit: 2024-03-02 | Discharge: 2024-03-02 | Disposition: A | Discharge status: Home / Self Care | Attending: Emergency Medicine | Admitting: Emergency Medicine

## 2024-03-02 DIAGNOSIS — R002 Palpitations: Secondary | ICD-10-CM

## 2024-03-02 NOTE — Discharge Instructions (Addendum)
 Continue to monitor your palpitations to see if there is any discernible pattern or if they become more frequent.  I have referred you to cardiology for further evaluation.  They will contact you to make an appointment.  If you have any worsening palpitations, especially if they are associated with chest pain, chest pressure, shortness of breath, sweating, nausea, pain going to left jaw or left shoulder, dizziness, or fainting you need to call 911 and go to the ER.

## 2024-03-02 NOTE — ED Provider Notes (Signed)
 [facility]    CSN: [id number] Arrival date & time: 03/02/24  1859      History   Chief Complaint Chief Complaint  Patient presents with   Palpitations    HPI Bobby Pace is a 63 y.o. male.   HPI  63 year old male with past medical history significant for diet-controlled prediabetes, hypertension, heart murmur, skin cancer presents for evaluation of palpitations that have been happening intermittently since approximately 1030 this morning.  No associated chest pain, shortness breath, dizziness, syncope.  Past Medical History:  Diagnosis Date   Cancer (HCC)    Skin   Heart murmur    Hypertension    Pneumonia    Pre-diabetes    diet control    Patient Active Problem List   Diagnosis Date Noted   Acute appendicitis with rupture 06/29/2017   Pure hypercholesterolemia 01/29/2015   Popliteal pain 11/04/2011   Hip pain, right 10/15/2011   Nonallopathic lesion of lumbosacral region 10/15/2011   HIP PAIN, RIGHT 04/11/2010   UNEQUAL LEG LENGTH 07/19/2009   ANKLE PAIN, RIGHT 06/21/2009   TENDINITIS TIBIALIS 06/21/2009   CAVUS DEFORMITY OF FOOT, ACQUIRED 06/21/2009   ABNORMALITY OF GAIT 06/21/2009    Past Surgical History:  Procedure Laterality Date   APPENDECTOMY     COLONOSCOPY     COLONOSCOPY N/A 01/07/2024   Procedure: COLONOSCOPY;  Surgeon: Therisa Bi, MD;  Location: [facility];  Service: Gastroenterology;  Laterality: N/A;   HERNIA REPAIR Right 2007   Laparoscopic repair of Right inguinal hernia with mesh   INGUINAL HERNIA REPAIR Left 03/11/2018   Procedure: HERNIA REPAIR INGUINAL ADULT;  Surgeon: Tye Millet, DO;  Location: [facility] ORS;  Service: General;  Laterality: Left;   LAPAROSCOPIC APPENDECTOMY N/A 06/29/2017   Procedure: APPENDECTOMY LAPAROSCOPIC;  Surgeon: Desiderio Schanz, MD;  Location: [facility] ORS;  Service: General;  Laterality: N/A;   MOHS SURGERY     POLYPECTOMY  01/07/2024   Procedure: POLYPECTOMY, INTESTINE;  Surgeon:  Therisa Bi, MD;  Location: [facility] ENDOSCOPY;  Service: Gastroenterology;;   SKIN CANCER EXCISION         Home Medications    Prior to Admission medications  Medication Sig Start Date End Date Taking? Authorizing Provider  hydrochlorothiazide (HYDRODIURIL) 25 MG tablet Take 1 tablet by mouth daily. 02/14/21  Yes [provider]  losartan (COZAAR) 25 MG tablet Take 25 mg by mouth daily.   Yes [provider]  aspirin EC 81 MG tablet Take 81 mg by mouth daily.    [provider]  benzonatate  (TESSALON ) 100 MG capsule Take 1 capsule (100 mg total) by mouth every 8 (eight) hours. Patient not taking: Reported on 01/07/2024 07/02/21   Crain, Whitney L, PA  calcium carbonate (TUMS - DOSED IN MG ELEMENTAL CALCIUM) 500 MG chewable tablet Chew 1-2 tablets by mouth daily as needed for indigestion or heartburn. Patient not taking: Reported on 01/07/2024    [provider]  fluticasone  (FLONASE ) 50 MCG/ACT nasal spray Place 1 spray into both nostrils daily. 07/02/21   Crain, Whitney L, PA  guaiFENesin  (MUCINEX ) 600 MG 12 hr tablet Take 1 tablet (600 mg total) by mouth 2 (two) times daily as needed for cough or to loosen phlegm. 07/02/21   Crain, Whitney L, PA  ibuprofen  (ADVIL ,MOTRIN ) 800 MG tablet Take 1 tablet (800 mg total) by mouth every 8 (eight) hours as needed for mild pain or moderate pain. 03/11/18   Tye, Isami, DO  montelukast  (SINGULAIR ) 10 MG tablet Take 1  tablet (10 mg total) by mouth at bedtime. Patient not taking: Reported on 01/07/2024 07/02/21   Crain, Whitney L, PA  Omega-3 Fatty Acids (FISH OIL) 1000 MG CAPS Take 1,000 mg by mouth 2 (two) times daily.    [provider]  Red Yeast Rice 600 MG CAPS Take 1,200 mg by mouth 2 (two) times daily.    [provider]    Family History Family History  Problem Relation Age of Onset   Healthy Mother    Congestive Heart Failure Father    Kidney disease Father     Social History Social  History[1]   Allergies   Patient has no known allergies.   Review of Systems Review of Systems  Respiratory:  Negative for chest tightness and shortness of breath.   Cardiovascular:  Positive for palpitations. Negative for chest pain.  Neurological:  Negative for dizziness and syncope.     Physical Exam Triage Vital Signs ED Triage Vitals  Encounter Vitals Group     BP      Girls Systolic BP Percentile      Girls Diastolic BP Percentile      Boys Systolic BP Percentile      Boys Diastolic BP Percentile      Pulse      Resp      Temp      Temp src      SpO2      Weight      Height      Head Circumference      Peak Flow      Pain Score      Pain Loc      Pain Education      Exclude from Growth Chart    No data found.  Updated Vital Signs BP (!) 137/93 (BP Location: Right Arm)   Pulse (!) 102   Temp 98.4 F (36.9 C) (Oral)   Resp 18   Wt 230 lb (104.3 kg)   SpO2 96%   BMI 31.19 kg/m   Visual Acuity Right Eye Distance:   Left Eye Distance:   Bilateral Distance:    Right Eye Near:   Left Eye Near:    Bilateral Near:     Physical Exam Vitals and nursing note reviewed.  Constitutional:      Appearance: Normal appearance.  Cardiovascular:     Rate and Rhythm: Normal rate and regular rhythm.     Pulses: Normal pulses.     Heart sounds: No murmur heard.    No friction rub. No gallop.  Pulmonary:     Effort: Pulmonary effort is normal.     Breath sounds: Normal breath sounds. No wheezing, rhonchi or rales.  Skin:    General: Skin is warm and dry.     Capillary Refill: Capillary refill takes less than 2 seconds.     Findings: No rash.  Neurological:     General: No focal deficit present.     Mental Status: He is alert and oriented to person, place, and time.      UC Treatments / Results  Labs (all labs ordered are listed, but only abnormal results are displayed) Labs Reviewed - No data to display  EKG Normal sinus rhythm with a ventricular  rate of 77 bpm PR interval 142 ms QRS duration 78 ms QT/QTc 376/425 ms Nonspecific ST and T wave abnormality.  Radiology No results found.  Procedures Procedures (including critical care time)  Medications Ordered in UC Medications -  No data to display  Initial Impression / Assessment and Plan / UC Course  I have reviewed the triage vital signs and the nursing notes.  Pertinent labs & imaging results that were available during my care of the patient were reviewed by me and considered in my medical decision making (see chart for details).   Patient is a pleasant, nontoxic-appearing 63 year old male presenting for evaluation of intermittent palpitations that started this morning around 10:30 AM.  No associated chest pain, shortness of breath, dizziness, fatigue, or syncope.  He reports that he would feel a vibration in his chest near his left sternal border and would feel the urge to cough but when he did cough it did not change things.  These palpitations would last for a matter of seconds and then resolve.  He did exercise at lunch performing leg exercises on the weight machines.  He also walks his dog this evening when he got home from work for approximately 15 minutes without difficulty.  He reports he feels better when he exercises.  He does drink caffeine and reports he had 2 cups of coffee this morning.  He also felt like he had some slight heartburn after breakfast so he took Tums.  Patient's cardiopulmonary exam reveals S1-S2 heart sounds with regular rate and rhythm and lung sounds are clear to auscultation all fields.  EKG collected at triage shows normal sinus rhythm with nonspecific ST and T wave abnormalities.  No ST elevation or depression.  Etiology of the patient's palpitations is unclear.  He does have an Scientist, Physiological and he did show me the changes in rhythm on his phone.  The morphology of his QRS complex does change but he remains in sinus rhythm.  Etiology of the patient's  palpitations is unclear.  I will refer him to cardiology for further evaluation.  He does have a family history of cardiac issues and reports that his father died of congestive heart failure.  He does not smoke nor has he ever.   Final Clinical Impressions(s) / UC Diagnoses   Final diagnoses:  Palpitations     Discharge Instructions      Continue to monitor your palpitations to see if there is any discernible pattern or if they become more frequent.  I have referred you to cardiology for further evaluation.  They will contact you to make an appointment.  If you have any worsening palpitations, especially if they are associated with chest pain, chest pressure, shortness of breath, sweating, nausea, pain going to left jaw or left shoulder, dizziness, or fainting you need to call 911 and go to the ER.     ED Prescriptions   None    PDMP not reviewed this encounter.    [1]  Social History Tobacco Use   Smoking status: Never   Smokeless tobacco: Never  Vaping Use   Vaping status: Never Used  Substance Use Topics   Alcohol use: Yes    Comment: rarely,none last 24hrs   Drug use: No     Bernardino Ditch, NP 03/02/24 1940

## 2024-03-02 NOTE — ED Triage Notes (Signed)
 Patient states that he started having palpitations this morning on and off. No chest pain.

## 2024-03-29 NOTE — Progress Notes (Signed)
" °  Cardiology Office Note:  .   Date:  03/29/2024  ID:  Bobby Pace, DOB 06-20-1960, MRN [id number] PCP: Alla Amis, MD  [facility] Providers Cardiologist:  Georganna Archer, MD { Chief Complaint: No chief complaint on file.   History of Present Illness: .    Bobby Pace is a 64 y.o. male with a PMH of HTN, HLD, prediabetes, obesity who presents as a new patient referral by Dr. Bernardino Cone for the evaluation of palpitations.      Patient seen in urgent care on 03/02/2024 for palpitations and was ultimately referred to me for evaluation.     Pre-Chart Notes: - CAC score = 0 in 2023  Discussed the use of AI scribe software for clinical note transcription with the patient, who gave verbal consent to proceed.  History of Present Illness       Studies Reviewed: SABRA    EKG: ***           Results   Risk Assessment/Calculations:    {Does this patient have ATRIAL FIBRILLATION?:[id number]} No BP recorded.  {Refresh Note OR Click here to enter BP  :1}***        Physical Exam:    VS:  There were no vitals taken for this visit. ***    Wt Readings from Last 3 Encounters:  03/02/24 230 lb (104.3 kg)  01/07/24 224 lb (101.6 kg)  03/11/18 215 lb 0.2 oz (97.5 kg)     GEN: Well nourished, well developed, in no acute distress NECK: No JVD; No carotid bruits CARDIAC: ***RRR, no murmurs, rubs, gallops RESPIRATORY:  Clear to auscultation without rales, wheezing or rhonchi  ABDOMEN: Soft, non-tender, non-distended, normal bowel sounds EXTREMITIES:  Warm and well perfused, no edema; No deformity, 2+ radial pulses PSYCH: Normal mood and affect   ASSESSMENT AND PLAN: .    Assessment and Plan  #Palpitations Complete echocardiogram 2-week heart monitor  #HLD #Hypertriglyceridemia - CAC score = 0 in 2023. -Takes fish oil and uses red yeast rice Lipoprotein a, HS-CRP  Assessment & Plan        {Are you ordering a CV Procedure  (e.g. stress test, cath, DCCV, TEE, etc)?   Press F2        :[id number]}    This note was written with the assistance of a dictation microphone or AI dictation software. Please excuse any typos or grammatical errors.   Signed, Georganna Archer, MD  03/29/2024 1:17 PM    [facility] "

## 2024-03-30 ENCOUNTER — Ambulatory Visit

## 2024-03-30 ENCOUNTER — Ambulatory Visit
Attending: Student in an Organized Health Care Education/Training Program | Admitting: Student in an Organized Health Care Education/Training Program

## 2024-03-30 VITALS — BP 160/84 | HR 83 | Ht 72.0 in | Wt 231.5 lb

## 2024-03-30 DIAGNOSIS — I10 Essential (primary) hypertension: Secondary | ICD-10-CM | POA: Diagnosis not present

## 2024-03-30 DIAGNOSIS — E782 Mixed hyperlipidemia: Secondary | ICD-10-CM | POA: Insufficient documentation

## 2024-03-30 DIAGNOSIS — R002 Palpitations: Secondary | ICD-10-CM

## 2024-03-30 MED ORDER — LOSARTAN POTASSIUM 50 MG PO TABS: 50.0000 mg | ORAL_TABLET | Freq: Every day | ORAL | 3 refills | Status: AC

## 2024-03-30 NOTE — Progress Notes (Unsigned)
 Enrolled patient for a 14 day Zio XT  monitor to be mailed to patients home

## 2024-03-30 NOTE — Patient Instructions (Signed)
 Medication Instructions:  INCREASE Losartan  50 mg daily   *If you need a refill on your cardiac medications before your next appointment, please call your pharmacy*  Lab Work: LP(a) High Sensitivity CRP Lipid panel   If you have labs (blood work) drawn today and your tests are completely normal, you will receive your results only by: MyChart Message (if you have MyChart) OR A paper copy in the mail If you have any lab test that is abnormal or we need to change your treatment, we will call you to review the results.  Testing/Procedures: Echocardiogram  Your physician has requested that you have an echocardiogram. Echocardiography is a painless test that uses sound waves to create images of your heart. It provides your doctor with information about the size and shape of your heart and how well your hearts chambers and valves are working. This procedure takes approximately one hour. There are no restrictions for this procedure. Please do NOT wear cologne, perfume, aftershave, or lotions (deodorant is allowed). Please arrive 15 minutes prior to your appointment time.  Please note: We ask at that you not bring children with you during ultrasound (echo/ vascular) testing. Due to room size and safety concerns, children are not allowed in the ultrasound rooms during exams. Our front office staff cannot provide observation of children in our lobby area while testing is being conducted. An adult accompanying a patient to their appointment will only be allowed in the ultrasound room at the discretion of the ultrasound technician under special circumstances. We apologize for any inconvenience.  2 week zio heart monitor   Your physician has requested that you wear a Zio heart monitor for __14___ days. This will be mailed to your home with instructions on how to apply the monitor and how to return it when finished. Please allow 2 weeks after returning the heart monitor before our office calls you with  the results.   Follow-Up: At [facility], you and your health needs are our priority.  As part of our continuing mission to provide you with exceptional heart care, our providers are all part of one team.  This team includes your primary Cardiologist (physician) and Advanced Practice Providers or APPs (Physician Assistants and Nurse Practitioners) who all work together to provide you with the care you need, when you need it.  Your next appointment:   3 month(s)  Provider:   Georganna Archer, MD

## 2024-03-31 LAB — LIPID PANEL
Chol/HDL Ratio: 6 ratio — ABNORMAL HIGH (ref 0.0–5.0)
Cholesterol, Total: 235 mg/dL — ABNORMAL HIGH (ref 100–199)
HDL: 39 mg/dL — ABNORMAL LOW
LDL Chol Calc (NIH): 121 mg/dL — ABNORMAL HIGH (ref 0–99)
Triglycerides: 427 mg/dL — ABNORMAL HIGH (ref 0–149)
VLDL Cholesterol Cal: 75 mg/dL — ABNORMAL HIGH (ref 5–40)

## 2024-03-31 LAB — HIGH SENSITIVITY CRP: CRP, High Sensitivity: 0.92 mg/L (ref 0.00–3.00)

## 2024-03-31 LAB — LIPOPROTEIN A (LPA): Lipoprotein (a): <8.4 nmol/L

## 2024-04-01 ENCOUNTER — Ambulatory Visit: Payer: Self-pay | Admitting: Student in an Organized Health Care Education/Training Program

## 2024-04-01 MED ORDER — ROSUVASTATIN CALCIUM 10 MG PO TABS: 10.0000 mg | ORAL_TABLET | Freq: Every day | ORAL | 1 refills | Status: AC

## 2024-05-04 ENCOUNTER — Ambulatory Visit (HOSPITAL_COMMUNITY)

## 2024-06-28 ENCOUNTER — Ambulatory Visit: Admitting: Student in an Organized Health Care Education/Training Program
